# Patient Record
Sex: Female | Born: 1975 | Race: White | Hispanic: No | Marital: Married | Smoking: Current every day smoker
Health system: Southern US, Community
[De-identification: ages and names within clinical notes are randomized; demographics above are authoritative.]

## PROBLEM LIST (undated history)

## (undated) DIAGNOSIS — F429 Obsessive-compulsive disorder, unspecified: Secondary | ICD-10-CM

## (undated) DIAGNOSIS — C801 Malignant (primary) neoplasm, unspecified: Secondary | ICD-10-CM

## (undated) DIAGNOSIS — M1990 Unspecified osteoarthritis, unspecified site: Secondary | ICD-10-CM

## (undated) DIAGNOSIS — F419 Anxiety disorder, unspecified: Secondary | ICD-10-CM

## (undated) DIAGNOSIS — G5621 Lesion of ulnar nerve, right upper limb: Secondary | ICD-10-CM

## (undated) DIAGNOSIS — E559 Vitamin D deficiency, unspecified: Secondary | ICD-10-CM

## (undated) DIAGNOSIS — F909 Attention-deficit hyperactivity disorder, unspecified type: Secondary | ICD-10-CM

## (undated) DIAGNOSIS — F329 Major depressive disorder, single episode, unspecified: Secondary | ICD-10-CM

## (undated) DIAGNOSIS — D630 Anemia in neoplastic disease: Secondary | ICD-10-CM

## (undated) DIAGNOSIS — D649 Anemia, unspecified: Secondary | ICD-10-CM

## (undated) DIAGNOSIS — F32A Depression, unspecified: Secondary | ICD-10-CM

## (undated) HISTORY — DX: Malignant (primary) neoplasm, unspecified: C80.1

## (undated) HISTORY — DX: Anemia, unspecified: D64.9

## (undated) HISTORY — DX: Anxiety disorder, unspecified: F41.9

## (undated) HISTORY — PX: CHOLECYSTECTOMY: SHX55

## (undated) HISTORY — PX: OTHER SURGICAL HISTORY: SHX169

## (undated) HISTORY — DX: Anemia in neoplastic disease: D63.0

## (undated) HISTORY — PX: ORIF TIBIA & FIBULA FRACTURES: SHX2131

## (undated) HISTORY — PX: BACK SURGERY: SHX140

---

## 2006-03-06 HISTORY — PX: GASTRIC BYPASS: SHX52

## 2011-01-07 ENCOUNTER — Ambulatory Visit (INDEPENDENT_AMBULATORY_CARE_PROVIDER_SITE_OTHER): Payer: Self-pay

## 2011-01-07 ENCOUNTER — Inpatient Hospital Stay (INDEPENDENT_AMBULATORY_CARE_PROVIDER_SITE_OTHER)
Admission: RE | Admit: 2011-01-07 | Discharge: 2011-01-07 | Disposition: A | Discharge status: Home / Self Care | Payer: Self-pay | Source: Ambulatory Visit | Attending: Family Medicine | Admitting: Family Medicine

## 2011-01-07 DIAGNOSIS — J019 Acute sinusitis, unspecified: Secondary | ICD-10-CM

## 2011-01-07 DIAGNOSIS — J310 Chronic rhinitis: Secondary | ICD-10-CM

## 2011-02-08 ENCOUNTER — Encounter: Payer: Self-pay | Admitting: Internal Medicine

## 2011-02-21 ENCOUNTER — Telehealth: Payer: Self-pay | Admitting: Oncology

## 2011-02-21 NOTE — Telephone Encounter (Signed)
called pts lmovm to rtn call to scheduled new pt appt for 03/14/2011 °

## 2011-02-22 ENCOUNTER — Telehealth: Payer: Self-pay | Admitting: Oncology

## 2011-02-22 NOTE — Telephone Encounter (Signed)
pt rtn call and confirmed appt for 03/14/2011

## 2011-02-27 ENCOUNTER — Ambulatory Visit: Payer: Self-pay | Admitting: Internal Medicine

## 2011-03-10 ENCOUNTER — Telehealth: Payer: Self-pay | Admitting: Oncology

## 2011-03-10 NOTE — Telephone Encounter (Signed)
pt called and r/s appt on 01/08 to 04/20/2011

## 2011-03-14 ENCOUNTER — Ambulatory Visit: Payer: Self-pay | Admitting: Oncology

## 2011-03-14 ENCOUNTER — Other Ambulatory Visit: Payer: Self-pay | Admitting: Lab

## 2011-03-14 ENCOUNTER — Ambulatory Visit: Payer: Self-pay

## 2011-03-31 ENCOUNTER — Telehealth: Payer: Self-pay | Admitting: Oncology

## 2011-03-31 ENCOUNTER — Ambulatory Visit: Payer: PRIVATE HEALTH INSURANCE

## 2011-03-31 ENCOUNTER — Other Ambulatory Visit: Payer: Self-pay | Admitting: *Deleted

## 2011-03-31 ENCOUNTER — Ambulatory Visit (HOSPITAL_BASED_OUTPATIENT_CLINIC_OR_DEPARTMENT_OTHER): Payer: PRIVATE HEALTH INSURANCE | Admitting: Oncology

## 2011-03-31 ENCOUNTER — Other Ambulatory Visit (HOSPITAL_BASED_OUTPATIENT_CLINIC_OR_DEPARTMENT_OTHER): Payer: PRIVATE HEALTH INSURANCE | Admitting: Lab

## 2011-03-31 ENCOUNTER — Encounter: Payer: Self-pay | Admitting: Oncology

## 2011-03-31 ENCOUNTER — Other Ambulatory Visit: Payer: Self-pay | Admitting: Oncology

## 2011-03-31 VITALS — BP 125/79 | HR 76 | Temp 98.2°F

## 2011-03-31 DIAGNOSIS — E611 Iron deficiency: Secondary | ICD-10-CM

## 2011-03-31 DIAGNOSIS — C801 Malignant (primary) neoplasm, unspecified: Secondary | ICD-10-CM

## 2011-03-31 DIAGNOSIS — D509 Iron deficiency anemia, unspecified: Secondary | ICD-10-CM | POA: Insufficient documentation

## 2011-03-31 HISTORY — DX: Malignant (primary) neoplasm, unspecified: C80.1

## 2011-03-31 LAB — COMPREHENSIVE METABOLIC PANEL
ALT: 16 U/L (ref 0–35)
CO2: 25 mEq/L (ref 19–32)
Calcium: 9.4 mg/dL (ref 8.4–10.5)
Chloride: 104 mEq/L (ref 96–112)
Sodium: 139 mEq/L (ref 135–145)
Total Bilirubin: 0.3 mg/dL (ref 0.3–1.2)
Total Protein: 6.7 g/dL (ref 6.0–8.3)

## 2011-03-31 LAB — CBC WITH DIFFERENTIAL/PLATELET
BASO%: 0.4 % (ref 0.0–2.0)
EOS%: 6.2 % (ref 0.0–7.0)
LYMPH%: 17.9 % (ref 14.0–49.7)
MCH: 27.1 pg (ref 25.1–34.0)
MCHC: 32.2 g/dL (ref 31.5–36.0)
MONO#: 0.9 10*3/uL (ref 0.1–0.9)
Platelets: 423 10*3/uL — ABNORMAL HIGH (ref 145–400)
RBC: 3.89 10*6/uL (ref 3.70–5.45)
WBC: 12.7 10*3/uL — ABNORMAL HIGH (ref 3.9–10.3)

## 2011-03-31 LAB — IRON AND TIBC
%SAT: 18 % — ABNORMAL LOW (ref 20–55)
TIBC: 456 ug/dL (ref 250–470)

## 2011-03-31 LAB — RETICULOCYTES
Immature Retic Fract: 22 % — ABNORMAL HIGH (ref 1.60–10.00)
RBC: 3.97 10*6/uL (ref 3.70–5.45)

## 2011-03-31 NOTE — Telephone Encounter (Signed)
Sent POF to scheduler with feraheme scheduling for 04/04/11. Requested they call patient with appointment. Orders in computer.

## 2011-04-01 ENCOUNTER — Telehealth: Payer: Self-pay | Admitting: *Deleted

## 2011-04-01 NOTE — Telephone Encounter (Signed)
patient confirmed over the phone the new date and time on 04-04-2011 for one hour infusion

## 2011-04-03 ENCOUNTER — Encounter: Payer: Self-pay | Admitting: *Deleted

## 2011-04-03 ENCOUNTER — Telehealth: Payer: Self-pay | Admitting: *Deleted

## 2011-04-03 ENCOUNTER — Encounter: Payer: Self-pay | Admitting: Oncology

## 2011-04-03 DIAGNOSIS — D630 Anemia in neoplastic disease: Secondary | ICD-10-CM

## 2011-04-03 NOTE — Progress Notes (Unsigned)
Jacki Cones from Scheduling came over to desk to advise she called pt regarding lab appt time/date for 04/04/11. Pt informed scheduler she was on her "monthly" so she didn't need any labs if they were for a pregnancy test.

## 2011-04-03 NOTE — Progress Notes (Signed)
Leah Herrera [id number] 1975-12-17 36 y.o.  Cc: Dr. Teodora Medici Dr. 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 Patient has received parenteral iron in the past while she was in [place]   REFERRING PHYSICIAN: Dr. Teodora Medici  HISTORY OF PRESENT ILLNESS:  Leah Herrera is a 36 y.o. female.  Without significant family history or personal history. 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 Due to that she developed iron deficiency anemia. She has received IV iron in the past. Most recent one was when she was in [place]. She has been seeing Dr. Dimas Aguas meds are for ongoing fertility treatments. Because of her issues you have iron deficiency in the past and patient's desire to get pregnant patient had a CBC performed including iron studies. Apparently she was found to have very low iron levels and patient was referred to hematology for further evaluation and management of iron deficiency anemia. Also to possibly consider parenteral iron to replenish her stores prior to getting pregnant. Patient today we are did come into the office pretty late and then she was in fact in a quite bit of her he due to the fact that her husband needed to go back to work and she is from [place]. She however upon questioning denies any fevers chills night sweats headaches shortness of breath chest pains palpitations she has no myalgias or arthralgias. She has not noticed any bleeding problems no hematuria hematochezia melena hemoptysis or hematemesis area and remainder of the 14 point review of systems is negative   Past Medical History  Diagnosis Date  . 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 Anemia   . Anxiety     History reviewed. No pertinent past surgical history.  History reviewed. No pertinent family history.  Social History History  Substance Use  Topics  . Smoking status: Never Smoker   . Smokeless tobacco: Never Used  . Alcohol Use: No    Allergies  Allergen Reactions  . Penicillins     No current outpatient prescriptions on file.    REVIEW OF SYSTEMS:  Constitutional: negative Eyes: negative Ears, nose, mouth, throat, and face: negative Respiratory: negative Cardiovascular: negative Gastrointestinal: negative Genitourinary:negative Hematologic/lymphatic: negative Musculoskeletal:negative Neurological: negative Behavioral/Psych: positive for anxiety and fatigue  ECOG performance Status:  PHYSICAL EXAMINATION: Blood pressure 125/79, pulse 76, temperature 98.2 F (36.8 C). NWG:NFAOZ, healthy, no distress, well nourished, well developed, anxious and distressed SKIN: skin color, texture, turgor are normal, no rashes or significant lesions HEAD: No masses, lesions, tenderness or abnormalities EYES: PERRLA, EOMI, Conjunctiva are pink and non-injected, sclera clear EARS: External ears normal OROPHARYNX:no exudate, no erythema and lips, buccal mucosa, and tongue normal  NECK: supple, no adenopathy, no bruits, no JVD, thyroid normal size, non-tender, without nodularity LYMPH:  no palpable lymphadenopathy, no hepatosplenomegaly BREAST:not examined LUNGS: clear to auscultation and percussion HEART: regular rate & rhythm, no murmurs and no gallops ABDOMEN:abdomen soft, non-tender, normal bowel sounds and no masses or organomegaly BACK: Back symmetric, no curvature., No CVA tenderness EXTREMITIES:no edema, no clubbing, no cyanosis  NEURO: alert & oriented x 3 with fluent speech, no focal motor/sensory deficits, gait normal, reflexes normal and symmetric  STUDIES/RESULTS: Lab Results  Component Value Date   WBC 12.7* 03/31/2011   HGB 10.5* 03/31/2011   HCT 32.8* 03/31/2011   MCV 84.3 03/31/2011   PLT 423* 03/31/2011     Chemistry  Component Value Date/Time   NA 139 03/31/2011 1342   K 4.7 03/31/2011 1342   CL  104 03/31/2011 1342   CO2 25 03/31/2011 1342   BUN 10 03/31/2011 1342   CREATININE 0.80 03/31/2011 1342      Component Value Date/Time   CALCIUM 9.4 03/31/2011 1342   ALKPHOS 69 03/31/2011 1342   AST 25 03/31/2011 1342   ALT 16 03/31/2011 1342   BILITOT 0.3 03/31/2011 1342      ASSESSMENT  36 year old female with previous history of gastric bypass now with 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 Patient is currently on some oral iron but she is quite concerned about her ongoing fatigue and weakness do to iron deficiency. She is considering receiving parenteral iron as she has done so in the past. Apparently from what she is telling me she has had iron dextran in the past the infusion lasted about 6 hours or so. She had no problems with it no side effects.  PLAN: I have reviewed patient's smear and she indeed does have a little bit of microcytosis. Recommendation is for patient to continue oral iron. However in order to replenish her stores quicker she may require parenteral iron. I have recommended getting iron studies on her as I am not able to locate the studies that were done and her gynecologist's office. We have ordered a ferritin TIBC and a total iron.  I have gone over the risks and benefits of parenteral iron including anaphylactic reactions. Patient also tells me that she is trying to get pregnant and I have my concerns of giving her parenteral iron. She states that she may get her menstrual cycle over the weekend most likely. Patient understands and I have of made this very clear to her that we do not give parenteral iron in the first or second trimester of pregnancy due to fetal losses. However third trimester parenteral iron can be given relatively safely. However it has to be given in a monitored setting at [facility]. Patient understands the implications of this. She certainly will let me know if she is indeed pregnant.  Patient also was concerned about not  being given the proper health care do to her being lost initial fall in the past. I promised her that I would call her and give her the information about her iron studies as soon as they are available. In fact I informed her that I would go ahead and get her set up for parenteral iron as soon as I see the results.   All questions were answered. The patient knows to call the clinic with any problems, questions or concerns. We can certainly see the patient much sooner if necessary.  Thank you so much for allowing me to participate in the care of Leah Herrera. I will continue to follow up the patient with you and assist in her care.  I spent 40 minutes counseling the patient face to face. The total time spent in the appointment was 60 minutes. Drue Second, MD Medical/Oncology [facility] [phone] (beeper) [phone] (Office)  04/03/2011, 9:37 PM 04/03/2011, 9:37 PM

## 2011-04-03 NOTE — Telephone Encounter (Signed)
Pt called left message  Stating " I was told my appt would be Thursday but then I got a call about my appt on Tuesday. Just want to make sure we are all on the same page." Returned pt's call, looking back in chart pt was called on Saturday by schedulers -to be scheduled on 04/04/11 per MD order. Confirmed with pt her appt for IV Iron is for Tues 04/04/11  At 11am per MD order.  Pt again questioned why she was to come in on Tues 04/04/11. Reviewed with pt per Dr. Milta Deiters order she was to receive her IV Iron on Tues 04/04/11 11am.  Pt again questioned why Tuesday instead of Thursday. Dr. Welton Flakes personally spoke with pt at this time and informed pt her IV Iron was to be on Tuesday 04/04/11.  Pt confirmed with MD she would be here for IV Iron on Tuesday 04/04/11 11am.  Per MD Verbal Order for pt to have pregnancy test prior to IV Iron given.  Onc Tx Sched sent for pt to have lab appt / pregnancy test prior to IV Iron.

## 2011-04-03 NOTE — Telephone Encounter (Signed)
Requested to Speak to Ms Leah Herrera,, Female answering advised sh could not come to phone. Requested for pt to call Dr. Milta Deiters nurse as pt is to have lab prior to appt. Female voice advised " yeah she spoke to someone already this am"  RN again requested pt to call to confirm Tuesday scheduled appts. He advised he would tell her to call.

## 2011-04-03 NOTE — Telephone Encounter (Signed)
Per scheduler "pt is on Monthly and if the lab is about a pregnancy test she doesn't need it."  Informed MD who advised pt was to call and notify us, cancel lab appt. onc Tx schedule sent

## 2011-04-04 ENCOUNTER — Ambulatory Visit: Payer: PRIVATE HEALTH INSURANCE

## 2011-04-04 ENCOUNTER — Other Ambulatory Visit: Payer: Self-pay

## 2011-04-04 ENCOUNTER — Telehealth: Payer: Self-pay | Admitting: *Deleted

## 2011-04-04 ENCOUNTER — Other Ambulatory Visit: Payer: PRIVATE HEALTH INSURANCE | Admitting: Lab

## 2011-04-04 ENCOUNTER — Ambulatory Visit (HOSPITAL_BASED_OUTPATIENT_CLINIC_OR_DEPARTMENT_OTHER): Payer: PRIVATE HEALTH INSURANCE

## 2011-04-04 VITALS — BP 118/72 | HR 87 | Temp 97.7°F

## 2011-04-04 DIAGNOSIS — D630 Anemia in neoplastic disease: Secondary | ICD-10-CM

## 2011-04-04 DIAGNOSIS — C801 Malignant (primary) neoplasm, unspecified: Secondary | ICD-10-CM

## 2011-04-04 MED ORDER — SODIUM CHLORIDE 0.9 % IV SOLN
1020.0000 mg | Freq: Once | INTRAVENOUS | Status: AC
  Administered 2011-04-04: 1020 mg via INTRAVENOUS
  Filled 2011-04-04: qty 34

## 2011-04-04 MED ORDER — SODIUM CHLORIDE 0.9 % IV SOLN: Freq: Once | INTRAVENOUS | Status: DC

## 2011-04-04 MED ORDER — SODIUM CHLORIDE 0.9 % IJ SOLN
3.0000 mL | Freq: Once | INTRAMUSCULAR | Status: DC | PRN
  Filled 2011-04-04: qty 10

## 2011-04-04 NOTE — Telephone Encounter (Signed)
  Pt lmovm at 11:12am states "there has been a mix up with my appts I have a complicated story to tell you if you are interested." Entered Chemo room at 12:08 to find pt to discuss her concerns. RN caring for pt advised she had already left. Will attempt to call pt at [phone].

## 2011-04-04 NOTE — Patient Instructions (Signed)
Pt ambulates out of dept with husband.  Tolerated 1st feraheme well.  Aware of how to contact Dr Milta Deiters nurse or on call MD if problems arise. dph

## 2011-04-04 NOTE — Telephone Encounter (Signed)
Na

## 2011-04-04 NOTE — Telephone Encounter (Signed)
Attempted to contact pt at P#  [phone] at 12:51 to discuss her concern. No answer, Left pt voice mail that I was returning her call and to call back to discuss her concerns from this morning.

## 2011-04-05 ENCOUNTER — Telehealth: Payer: Self-pay | Admitting: *Deleted

## 2011-04-05 NOTE — Telephone Encounter (Signed)
Called pt, who voiced her concerns regarding her scheduled appts , communication regarding her treatment times/dates.  Discussed with pt I would take her concerns to Dr. Welton Flakes and management immediately.  I voiced that I appreciated her informing me of the situation and I would be speaking with MD and management after I hang up with her.  Informed Dr. Welton Flakes who escorted me to speak with Jule Ser Director Med Onc about pt's concerns/situation. After speaking with him he informed Dr. Welton Flakes and I he would also call pt regarding her concern.

## 2011-04-20 ENCOUNTER — Ambulatory Visit: Payer: Self-pay

## 2011-04-20 ENCOUNTER — Encounter: Payer: Self-pay | Admitting: *Deleted

## 2011-04-20 ENCOUNTER — Ambulatory Visit: Payer: Self-pay | Admitting: Oncology

## 2011-04-20 ENCOUNTER — Telehealth: Payer: Self-pay | Admitting: *Deleted

## 2011-04-20 ENCOUNTER — Other Ambulatory Visit: Payer: Self-pay | Admitting: Lab

## 2011-04-20 NOTE — Telephone Encounter (Signed)
Reviewed with MD pt's concerns. Per MD ok to reschedule appt per pt request.  Called LMOVM for per MD ok to reschedule 04/21/11 appt. Scheduler will call her to set up appt.  Onc Tx [employer]

## 2011-04-20 NOTE — Telephone Encounter (Signed)
Returned pt's call regarding visit per Dr. Welton Flakes pt has an appt for f/u to check her blood levels and discuss if future treatments are needed.  Pt states " Can we push that appt to later in the month because I haven't paid for my insurance."   Discussed with pt I was not sure of the appts available going forward for month of february, I will check with MD regarding her concern.  Informed pt she would receive a call from scheduling for any changes regarding her appt.  Pt verbalized understanding

## 2011-04-20 NOTE — Telephone Encounter (Signed)
Pt called LMOVM states "I got a call stating I have an appt on 04/21/11 and I need to know why I am coming."

## 2011-04-21 ENCOUNTER — Ambulatory Visit: Payer: PRIVATE HEALTH INSURANCE | Admitting: Family

## 2011-04-21 ENCOUNTER — Other Ambulatory Visit: Payer: PRIVATE HEALTH INSURANCE | Admitting: Lab

## 2011-10-20 DIAGNOSIS — M542 Cervicalgia: Secondary | ICD-10-CM | POA: Insufficient documentation

## 2011-10-20 DIAGNOSIS — M545 Low back pain, unspecified: Secondary | ICD-10-CM | POA: Insufficient documentation

## 2011-10-20 DIAGNOSIS — G8929 Other chronic pain: Secondary | ICD-10-CM | POA: Insufficient documentation

## 2011-10-25 ENCOUNTER — Other Ambulatory Visit: Payer: Self-pay | Admitting: Family Medicine

## 2011-10-25 DIAGNOSIS — M542 Cervicalgia: Secondary | ICD-10-CM

## 2011-10-25 DIAGNOSIS — M545 Low back pain: Secondary | ICD-10-CM

## 2011-10-29 ENCOUNTER — Ambulatory Visit
Admission: RE | Admit: 2011-10-29 | Discharge: 2011-10-29 | Disposition: A | Discharge status: Home / Self Care | Payer: 59 | Source: Ambulatory Visit | Attending: Family Medicine | Admitting: Family Medicine

## 2011-10-29 DIAGNOSIS — M545 Low back pain: Secondary | ICD-10-CM

## 2011-10-29 DIAGNOSIS — M542 Cervicalgia: Secondary | ICD-10-CM

## 2011-10-29 MED ORDER — GADOBENATE DIMEGLUMINE 529 MG/ML IV SOLN
13.0000 mL | Freq: Once | INTRAVENOUS | Status: AC | PRN
  Administered 2011-10-29: 13 mL via INTRAVENOUS

## 2012-01-24 ENCOUNTER — Ambulatory Visit: Payer: Self-pay | Admitting: Oncology

## 2012-01-29 ENCOUNTER — Ambulatory Visit: Payer: Self-pay | Admitting: Oncology

## 2012-02-04 ENCOUNTER — Ambulatory Visit: Payer: Self-pay | Admitting: Oncology

## 2012-03-06 ENCOUNTER — Ambulatory Visit: Payer: Self-pay | Admitting: Oncology

## 2012-03-25 LAB — CBC CANCER CENTER
Bands: 1 %
Basophil: 1 %
Eosinophil: 1 %
HCT: 45 % (ref 35.0–47.0)
HGB: 15 g/dL (ref 12.0–16.0)
Lymphocytes: 14 %
MCH: 29.8 pg (ref 26.0–34.0)
MCHC: 33.2 g/dL (ref 32.0–36.0)
Monocytes: 1 %
RBC: 5.01 10*6/uL (ref 3.80–5.20)
RDW: 24.3 % — ABNORMAL HIGH (ref 11.5–14.5)
Segmented Neutrophils: 79 %
Variant Lymphocyte: 3 %
WBC: 11 x10 3/mm (ref 3.6–11.0)

## 2012-03-25 LAB — IRON AND TIBC
Iron Saturation: 22 %
Unbound Iron-Bind.Cap.: 297 ug/dL

## 2012-03-25 LAB — SEDIMENTATION RATE: Erythrocyte Sed Rate: 3 mm/hr (ref 0–20)

## 2012-03-25 LAB — FOLATE: Folic Acid: 3.3 ng/mL (ref 3.1–100.0)

## 2012-04-06 ENCOUNTER — Ambulatory Visit: Payer: Self-pay | Admitting: Oncology

## 2012-06-03 ENCOUNTER — Ambulatory Visit: Payer: Self-pay | Admitting: Oncology

## 2012-06-03 LAB — IRON AND TIBC
Iron Bind.Cap.(Total): 395 ug/dL (ref 250–450)
Iron Saturation: 19 %
Unbound Iron-Bind.Cap.: 318 ug/dL

## 2012-06-03 LAB — FERRITIN: Ferritin (ARMC): 12 ng/mL (ref 8–388)

## 2012-06-03 LAB — CBC CANCER CENTER
Basophil %: 0.5 %
HCT: 43.4 % (ref 35.0–47.0)
HGB: 14.7 g/dL (ref 12.0–16.0)
MCH: 31.2 pg (ref 26.0–34.0)
MCHC: 33.8 g/dL (ref 32.0–36.0)
Monocyte #: 0.7 x10 3/mm (ref 0.2–0.9)
Monocyte %: 5 %
Neutrophil #: 10.1 x10 3/mm — ABNORMAL HIGH (ref 1.4–6.5)
RBC: 4.69 10*6/uL (ref 3.80–5.20)
RDW: 13.6 % (ref 11.5–14.5)

## 2012-06-04 ENCOUNTER — Ambulatory Visit: Payer: Self-pay | Admitting: Oncology

## 2012-09-30 ENCOUNTER — Other Ambulatory Visit: Payer: Self-pay | Admitting: Gastroenterology

## 2012-09-30 DIAGNOSIS — R634 Abnormal weight loss: Secondary | ICD-10-CM

## 2012-09-30 DIAGNOSIS — R112 Nausea with vomiting, unspecified: Secondary | ICD-10-CM

## 2012-09-30 DIAGNOSIS — R109 Unspecified abdominal pain: Secondary | ICD-10-CM

## 2012-10-04 ENCOUNTER — Other Ambulatory Visit: Payer: 59

## 2012-10-04 ENCOUNTER — Ambulatory Visit: Payer: Self-pay | Admitting: Oncology

## 2012-10-09 ENCOUNTER — Ambulatory Visit: Payer: Self-pay | Admitting: Gastroenterology

## 2012-10-11 ENCOUNTER — Ambulatory Visit
Admission: RE | Admit: 2012-10-11 | Discharge: 2012-10-11 | Disposition: A | Discharge status: Home / Self Care | Payer: 59 | Source: Ambulatory Visit | Attending: Gastroenterology | Admitting: Gastroenterology

## 2012-10-11 ENCOUNTER — Other Ambulatory Visit: Payer: Self-pay | Admitting: Gastroenterology

## 2012-10-11 DIAGNOSIS — R634 Abnormal weight loss: Secondary | ICD-10-CM

## 2012-10-11 DIAGNOSIS — R109 Unspecified abdominal pain: Secondary | ICD-10-CM

## 2012-10-11 DIAGNOSIS — R112 Nausea with vomiting, unspecified: Secondary | ICD-10-CM

## 2012-10-11 LAB — CBC CANCER CENTER
Basophil %: 0.9 %
Eosinophil #: 0.3 x10 3/mm (ref 0.0–0.7)
Eosinophil %: 3.3 %
HCT: 36 % (ref 35.0–47.0)
HGB: 12.3 g/dL (ref 12.0–16.0)
Lymphocyte #: 2.4 x10 3/mm (ref 1.0–3.6)
Lymphocyte %: 23.4 %
MCH: 30.6 pg (ref 26.0–34.0)
MCHC: 34.2 g/dL (ref 32.0–36.0)
MCV: 90 fL (ref 80–100)
Monocyte %: 6 %
Neutrophil #: 6.9 x10 3/mm — ABNORMAL HIGH (ref 1.4–6.5)
Neutrophil %: 66.4 %
Platelet: 394 x10 3/mm (ref 150–440)
RDW: 14 % (ref 11.5–14.5)
WBC: 10.4 x10 3/mm (ref 3.6–11.0)

## 2012-10-11 LAB — IRON AND TIBC
Iron Bind.Cap.(Total): 364 ug/dL (ref 250–450)
Iron: 32 ug/dL — ABNORMAL LOW (ref 50–170)
Unbound Iron-Bind.Cap.: 332 ug/dL

## 2012-11-04 ENCOUNTER — Ambulatory Visit: Payer: Self-pay | Admitting: Oncology

## 2013-01-10 ENCOUNTER — Ambulatory Visit: Payer: Self-pay | Admitting: Oncology

## 2013-01-10 LAB — CBC CANCER CENTER
Basophil %: 0.4 %
Eosinophil %: 0.9 %
HCT: 43 % (ref 35.0–47.0)
Lymphocyte %: 10.1 %
MCHC: 32.7 g/dL (ref 32.0–36.0)
MCV: 92 fL (ref 80–100)
Monocyte #: 1 x10 3/mm — ABNORMAL HIGH (ref 0.2–0.9)
Neutrophil #: 11.7 x10 3/mm — ABNORMAL HIGH (ref 1.4–6.5)
Neutrophil %: 81.9 %
RBC: 4.66 10*6/uL (ref 3.80–5.20)
WBC: 14.3 x10 3/mm — ABNORMAL HIGH (ref 3.6–11.0)

## 2013-01-10 LAB — IRON AND TIBC
Iron Saturation: 8 %
Iron: 17 ug/dL — ABNORMAL LOW (ref 50–170)

## 2013-01-10 LAB — FERRITIN: Ferritin (ARMC): 97 ng/mL (ref 8–388)

## 2013-01-16 ENCOUNTER — Emergency Department: Payer: Self-pay | Admitting: Emergency Medicine

## 2013-01-16 LAB — URINALYSIS, COMPLETE
Glucose,UR: NEGATIVE mg/dL (ref 0–75)
Nitrite: NEGATIVE
Ph: 7 (ref 4.5–8.0)
Protein: NEGATIVE
RBC,UR: 1 /HPF (ref 0–5)
Specific Gravity: 1.002 (ref 1.003–1.030)
Squamous Epithelial: 1
WBC UR: <1 /HPF (ref 0–5)

## 2013-01-16 LAB — COMPREHENSIVE METABOLIC PANEL
Anion Gap: 4 — ABNORMAL LOW (ref 7–16)
Creatinine: 0.73 mg/dL (ref 0.60–1.30)
EGFR (African American): >60
EGFR (Non-African Amer.): >60
Osmolality: 276 (ref 275–301)
Potassium: 3.3 mmol/L — ABNORMAL LOW (ref 3.5–5.1)
SGPT (ALT): 20 U/L (ref 12–78)
Sodium: 140 mmol/L (ref 136–145)
Total Protein: 7.2 g/dL (ref 6.4–8.2)

## 2013-01-16 LAB — PREGNANCY, URINE: Pregnancy Test, Urine: NEGATIVE m[IU]/mL

## 2013-01-16 LAB — CBC
MCHC: 34 g/dL (ref 32.0–36.0)
MCV: 92 fL (ref 80–100)
Platelet: 330 10*3/uL (ref 150–440)
RBC: 4.25 10*6/uL (ref 3.80–5.20)
WBC: 10.8 10*3/uL (ref 3.6–11.0)

## 2013-01-16 LAB — LITHIUM LEVEL: Lithium: 0.26 mmol/L — ABNORMAL LOW

## 2013-01-16 LAB — LIPASE, BLOOD: Lipase: 144 U/L (ref 73–393)

## 2013-02-03 ENCOUNTER — Ambulatory Visit: Payer: Self-pay | Admitting: Oncology

## 2013-04-14 ENCOUNTER — Ambulatory Visit: Payer: Self-pay | Admitting: Oncology

## 2013-04-15 LAB — CBC CANCER CENTER
BASOS ABS: 0.1 x10 3/mm (ref 0.0–0.1)
Basophil %: 0.6 %
Eosinophil #: 0.2 x10 3/mm (ref 0.0–0.7)
Eosinophil %: 0.8 %
HCT: 41 % (ref 35.0–47.0)
HGB: 13.4 g/dL (ref 12.0–16.0)
LYMPHS PCT: 9.2 %
Lymphocyte #: 1.9 x10 3/mm (ref 1.0–3.6)
MCH: 30.8 pg (ref 26.0–34.0)
MCHC: 32.5 g/dL (ref 32.0–36.0)
MCV: 95 fL (ref 80–100)
Monocyte #: 1.1 x10 3/mm — ABNORMAL HIGH (ref 0.2–0.9)
Monocyte %: 5.3 %
NEUTROS PCT: 84.1 %
Neutrophil #: 17.9 x10 3/mm — ABNORMAL HIGH (ref 1.4–6.5)
PLATELETS: 367 x10 3/mm (ref 150–440)
RBC: 4.34 10*6/uL (ref 3.80–5.20)
RDW: 13.1 % (ref 11.5–14.5)
WBC: 21.2 x10 3/mm — ABNORMAL HIGH (ref 3.6–11.0)

## 2013-04-15 LAB — IRON AND TIBC
IRON BIND. CAP.(TOTAL): 302 ug/dL (ref 250–450)
Iron Saturation: 17 %
Iron: 52 ug/dL (ref 50–170)
Unbound Iron-Bind.Cap.: 250 ug/dL

## 2013-04-15 LAB — FERRITIN: Ferritin (ARMC): 10 ng/mL (ref 8–388)

## 2013-05-04 ENCOUNTER — Ambulatory Visit: Payer: Self-pay | Admitting: Oncology

## 2013-07-11 ENCOUNTER — Ambulatory Visit: Payer: Self-pay | Admitting: Oncology

## 2014-04-10 ENCOUNTER — Ambulatory Visit: Payer: Self-pay | Admitting: Oncology

## 2014-04-10 LAB — CBC CANCER CENTER
BASOS ABS: 0.1 x10 3/mm (ref 0.0–0.1)
BASOS PCT: 0.9 %
Eosinophil #: 0.3 x10 3/mm (ref 0.0–0.7)
Eosinophil %: 2.6 %
HCT: 35.8 % (ref 35.0–47.0)
HGB: 11.8 g/dL — ABNORMAL LOW (ref 12.0–16.0)
Lymphocyte #: 2.2 x10 3/mm (ref 1.0–3.6)
Lymphocyte %: 19.3 %
MCH: 29.1 pg (ref 26.0–34.0)
MCHC: 32.9 g/dL (ref 32.0–36.0)
MCV: 89 fL (ref 80–100)
Monocyte #: 0.8 x10 3/mm (ref 0.2–0.9)
Monocyte %: 6.6 %
NEUTROS ABS: 8.2 x10 3/mm — AB (ref 1.4–6.5)
Neutrophil %: 70.6 %
Platelet: 482 x10 3/mm — ABNORMAL HIGH (ref 150–440)
RBC: 4.04 10*6/uL (ref 3.80–5.20)
RDW: 13.8 % (ref 11.5–14.5)
WBC: 11.6 x10 3/mm — ABNORMAL HIGH (ref 3.6–11.0)

## 2014-04-10 LAB — IRON AND TIBC
IRON BIND. CAP.(TOTAL): 473 ug/dL — AB (ref 250–450)
IRON SATURATION: 7 %
Iron: 35 ug/dL — ABNORMAL LOW (ref 50–170)
UNBOUND IRON-BIND. CAP.: 438 ug/dL

## 2014-04-10 LAB — FERRITIN: Ferritin (ARMC): 4 ng/mL — ABNORMAL LOW (ref 8–388)

## 2014-05-05 ENCOUNTER — Ambulatory Visit
Admit: 2014-05-05 | Disposition: A | Discharge status: Home / Self Care | Payer: Self-pay | Attending: Oncology | Admitting: Oncology

## 2014-06-18 ENCOUNTER — Ambulatory Visit
Admit: 2014-06-18 | Disposition: A | Discharge status: Home / Self Care | Payer: Self-pay | Attending: Oncology | Admitting: Oncology

## 2014-07-11 ENCOUNTER — Other Ambulatory Visit: Payer: Self-pay | Admitting: Oncology

## 2014-07-11 DIAGNOSIS — D509 Iron deficiency anemia, unspecified: Secondary | ICD-10-CM

## 2014-07-13 ENCOUNTER — Other Ambulatory Visit: Payer: Self-pay | Admitting: Oncology

## 2014-07-13 DIAGNOSIS — D509 Iron deficiency anemia, unspecified: Secondary | ICD-10-CM

## 2014-07-14 ENCOUNTER — Ambulatory Visit: Payer: 59 | Admitting: Oncology

## 2014-07-14 ENCOUNTER — Encounter (INDEPENDENT_AMBULATORY_CARE_PROVIDER_SITE_OTHER): Payer: Self-pay

## 2014-07-14 ENCOUNTER — Other Ambulatory Visit: Payer: Self-pay

## 2014-07-14 ENCOUNTER — Inpatient Hospital Stay: Payer: 59 | Attending: Oncology

## 2014-07-14 DIAGNOSIS — D509 Iron deficiency anemia, unspecified: Secondary | ICD-10-CM | POA: Insufficient documentation

## 2014-07-14 DIAGNOSIS — E538 Deficiency of other specified B group vitamins: Secondary | ICD-10-CM | POA: Diagnosis not present

## 2014-07-14 LAB — CBC WITH DIFFERENTIAL/PLATELET
Basophils Absolute: 0.1 10*3/uL (ref 0–0.1)
Basophils Relative: 1 %
Eosinophils Absolute: 0.5 10*3/uL (ref 0–0.7)
Eosinophils Relative: 4 %
HCT: 40.4 % (ref 35.0–47.0)
Hemoglobin: 13.2 g/dL (ref 12.0–16.0)
LYMPHS ABS: 2.6 10*3/uL (ref 1.0–3.6)
LYMPHS PCT: 21 %
MCH: 31 pg (ref 26.0–34.0)
MCHC: 32.7 g/dL (ref 32.0–36.0)
MCV: 94.7 fL (ref 80.0–100.0)
MONOS PCT: 5 %
Monocytes Absolute: 0.7 10*3/uL (ref 0.2–0.9)
NEUTROS ABS: 8.6 10*3/uL — AB (ref 1.4–6.5)
NEUTROS PCT: 69 %
PLATELETS: 327 10*3/uL (ref 150–440)
RBC: 4.27 MIL/uL (ref 3.80–5.20)
RDW: 14.9 % — ABNORMAL HIGH (ref 11.5–14.5)
WBC: 12.5 10*3/uL — ABNORMAL HIGH (ref 3.6–11.0)

## 2014-07-14 LAB — IRON AND TIBC
IRON: 53 ug/dL (ref 28–170)
SATURATION RATIOS: 16 % (ref 10.4–31.8)
TIBC: 338 ug/dL (ref 250–450)
UIBC: 285 ug/dL

## 2014-07-14 LAB — FERRITIN: FERRITIN: 16 ng/mL (ref 11–307)

## 2014-08-26 ENCOUNTER — Encounter (HOSPITAL_BASED_OUTPATIENT_CLINIC_OR_DEPARTMENT_OTHER): Payer: Self-pay | Admitting: *Deleted

## 2014-08-28 ENCOUNTER — Ambulatory Visit (HOSPITAL_BASED_OUTPATIENT_CLINIC_OR_DEPARTMENT_OTHER): Payer: 59 | Admitting: Anesthesiology

## 2014-08-28 ENCOUNTER — Encounter (HOSPITAL_BASED_OUTPATIENT_CLINIC_OR_DEPARTMENT_OTHER)
Admission: RE | Disposition: A | Discharge status: Home / Self Care | Payer: Self-pay | Source: Ambulatory Visit | Attending: Orthopedic Surgery

## 2014-08-28 ENCOUNTER — Encounter (HOSPITAL_BASED_OUTPATIENT_CLINIC_OR_DEPARTMENT_OTHER): Payer: Self-pay | Admitting: Anesthesiology

## 2014-08-28 ENCOUNTER — Ambulatory Visit (HOSPITAL_BASED_OUTPATIENT_CLINIC_OR_DEPARTMENT_OTHER)
Admission: RE | Admit: 2014-08-28 | Discharge: 2014-08-28 | Disposition: A | Discharge status: Home / Self Care | Payer: 59 | Source: Ambulatory Visit | Attending: Orthopedic Surgery | Admitting: Orthopedic Surgery

## 2014-08-28 DIAGNOSIS — M1990 Unspecified osteoarthritis, unspecified site: Secondary | ICD-10-CM | POA: Insufficient documentation

## 2014-08-28 DIAGNOSIS — F17200 Nicotine dependence, unspecified, uncomplicated: Secondary | ICD-10-CM | POA: Insufficient documentation

## 2014-08-28 DIAGNOSIS — Z79899 Other long term (current) drug therapy: Secondary | ICD-10-CM | POA: Diagnosis not present

## 2014-08-28 DIAGNOSIS — D649 Anemia, unspecified: Secondary | ICD-10-CM | POA: Insufficient documentation

## 2014-08-28 DIAGNOSIS — G5621 Lesion of ulnar nerve, right upper limb: Secondary | ICD-10-CM | POA: Diagnosis present

## 2014-08-28 HISTORY — DX: Unspecified osteoarthritis, unspecified site: M19.90

## 2014-08-28 HISTORY — PX: ULNAR NERVE TRANSPOSITION: SHX2595

## 2014-08-28 HISTORY — DX: Major depressive disorder, single episode, unspecified: F32.9

## 2014-08-28 HISTORY — DX: Depression, unspecified: F32.A

## 2014-08-28 HISTORY — DX: Lesion of ulnar nerve, right upper limb: G56.21

## 2014-08-28 LAB — POCT HEMOGLOBIN-HEMACUE: HEMOGLOBIN: 13 g/dL (ref 12.0–15.0)

## 2014-08-28 SURGERY — ULNAR NERVE DECOMPRESSION/TRANSPOSITION
Anesthesia: General | Site: Elbow | Laterality: Right

## 2014-08-28 MED ORDER — PROPOFOL 10 MG/ML IV BOLUS
INTRAVENOUS | Status: DC | PRN
  Administered 2014-08-28: 180 mg via INTRAVENOUS

## 2014-08-28 MED ORDER — FENTANYL CITRATE (PF) 100 MCG/2ML IJ SOLN
INTRAMUSCULAR | Status: AC
  Filled 2014-08-28: qty 2

## 2014-08-28 MED ORDER — HYDROMORPHONE HCL 1 MG/ML IJ SOLN
0.2500 mg | INTRAMUSCULAR | Status: DC | PRN
  Administered 2014-08-28: 0.5 mg via INTRAVENOUS

## 2014-08-28 MED ORDER — CEFAZOLIN SODIUM-DEXTROSE 2-3 GM-% IV SOLR
INTRAVENOUS | Status: DC | PRN
  Administered 2014-08-28: 2 g via INTRAVENOUS

## 2014-08-28 MED ORDER — FENTANYL CITRATE (PF) 100 MCG/2ML IJ SOLN
INTRAMUSCULAR | Status: AC
  Filled 2014-08-28: qty 4

## 2014-08-28 MED ORDER — LACTATED RINGERS IV SOLN
INTRAVENOUS | Status: DC
  Administered 2014-08-28 (×3): via INTRAVENOUS

## 2014-08-28 MED ORDER — FENTANYL CITRATE (PF) 100 MCG/2ML IJ SOLN
50.0000 ug | INTRAMUSCULAR | Status: DC | PRN
  Administered 2014-08-28 (×2): 50 ug via INTRAVENOUS

## 2014-08-28 MED ORDER — PROMETHAZINE HCL 25 MG/ML IJ SOLN: 6.2500 mg | INTRAMUSCULAR | Status: DC | PRN

## 2014-08-28 MED ORDER — ONDANSETRON HCL 4 MG/2ML IJ SOLN
INTRAMUSCULAR | Status: DC | PRN
  Administered 2014-08-28: 4 mg via INTRAVENOUS

## 2014-08-28 MED ORDER — MIDAZOLAM HCL 2 MG/2ML IJ SOLN
INTRAMUSCULAR | Status: AC
  Filled 2014-08-28: qty 2

## 2014-08-28 MED ORDER — SCOPOLAMINE 1 MG/3DAYS TD PT72: 1.0000 | MEDICATED_PATCH | Freq: Once | TRANSDERMAL | Status: DC | PRN

## 2014-08-28 MED ORDER — KETOROLAC TROMETHAMINE 30 MG/ML IJ SOLN
INTRAMUSCULAR | Status: AC
  Filled 2014-08-28: qty 1

## 2014-08-28 MED ORDER — LIDOCAINE HCL (CARDIAC) 20 MG/ML IV SOLN
INTRAVENOUS | Status: DC | PRN
  Administered 2014-08-28: 60 mg via INTRAVENOUS

## 2014-08-28 MED ORDER — GLYCOPYRROLATE 0.2 MG/ML IJ SOLN
0.2000 mg | Freq: Once | INTRAMUSCULAR | Status: AC | PRN
  Administered 2014-08-28: 0.2 mg via INTRAVENOUS

## 2014-08-28 MED ORDER — DEXAMETHASONE SODIUM PHOSPHATE 4 MG/ML IJ SOLN
INTRAMUSCULAR | Status: DC | PRN
  Administered 2014-08-28: 10 mg via INTRAVENOUS

## 2014-08-28 MED ORDER — OXYCODONE-ACETAMINOPHEN 5-325 MG PO TABS: 2.0000 | ORAL_TABLET | ORAL | Status: DC | PRN

## 2014-08-28 MED ORDER — KETOROLAC TROMETHAMINE 30 MG/ML IJ SOLN
30.0000 mg | Freq: Once | INTRAMUSCULAR | Status: AC | PRN
  Administered 2014-08-28: 30 mg via INTRAVENOUS

## 2014-08-28 MED ORDER — CEFAZOLIN SODIUM-DEXTROSE 2-3 GM-% IV SOLR
INTRAVENOUS | Status: AC
  Filled 2014-08-28: qty 50

## 2014-08-28 MED ORDER — BUPIVACAINE HCL (PF) 0.25 % IJ SOLN
INTRAMUSCULAR | Status: DC | PRN
  Administered 2014-08-28: 20 mL

## 2014-08-28 MED ORDER — MIDAZOLAM HCL 2 MG/2ML IJ SOLN
1.0000 mg | INTRAMUSCULAR | Status: DC | PRN
  Administered 2014-08-28 (×2): 1 mg via INTRAVENOUS

## 2014-08-28 MED ORDER — OXYCODONE HCL 5 MG PO TABS
5.0000 mg | ORAL_TABLET | Freq: Once | ORAL | Status: AC
  Administered 2014-08-28: 5 mg via ORAL

## 2014-08-28 MED ORDER — HYDROMORPHONE HCL 1 MG/ML IJ SOLN
INTRAMUSCULAR | Status: AC
  Filled 2014-08-28: qty 1

## 2014-08-28 MED ORDER — OXYCODONE HCL 5 MG PO TABS
ORAL_TABLET | ORAL | Status: AC
  Filled 2014-08-28: qty 1

## 2014-08-28 MED ORDER — FENTANYL CITRATE (PF) 100 MCG/2ML IJ SOLN
25.0000 ug | INTRAMUSCULAR | Status: DC | PRN
  Administered 2014-08-28 (×2): 50 ug via INTRAVENOUS
  Administered 2014-08-28 (×2): 25 ug via INTRAVENOUS

## 2014-08-28 SURGICAL SUPPLY — 72 items
BANDAGE ELASTIC 3 VELCRO ST LF (GAUZE/BANDAGES/DRESSINGS) ×6 IMPLANT
BANDAGE ELASTIC 4 VELCRO ST LF (GAUZE/BANDAGES/DRESSINGS) IMPLANT
BANDAGE ELASTIC 6 VELCRO ST LF (GAUZE/BANDAGES/DRESSINGS) ×3 IMPLANT
BLADE CLIPPER SURG (BLADE) IMPLANT
BLADE SURG 15 STRL LF DISP TIS (BLADE) ×3 IMPLANT
BLADE SURG 15 STRL SS (BLADE) ×6
BNDG CONFORM 3 STRL LF (GAUZE/BANDAGES/DRESSINGS) ×3 IMPLANT
BNDG GAUZE ELAST 4 BULKY (GAUZE/BANDAGES/DRESSINGS) ×3 IMPLANT
BRUSH SCRUB EZ PLAIN DRY (MISCELLANEOUS) ×3 IMPLANT
CANISTER SUCT 1200ML W/VALVE (MISCELLANEOUS) ×3 IMPLANT
CLOSURE WOUND 1/2 X4 (GAUZE/BANDAGES/DRESSINGS)
CORDS BIPOLAR (ELECTRODE) ×3 IMPLANT
COVER BACK TABLE 60X90IN (DRAPES) ×3 IMPLANT
COVER MAYO STAND STRL (DRAPES) ×3 IMPLANT
CUFF TOURN SGL LL 18 NRW (TOURNIQUET CUFF) ×6 IMPLANT
CUFF TOURNIQUET SINGLE 18IN (TOURNIQUET CUFF) IMPLANT
DECANTER SPIKE VIAL GLASS SM (MISCELLANEOUS) IMPLANT
DRAPE EXTREMITY T 121X128X90 (DRAPE) ×3 IMPLANT
DRAPE INCISE IOBAN 66X45 STRL (DRAPES) IMPLANT
DRAPE SURG 17X23 STRL (DRAPES) ×3 IMPLANT
DRSG EMULSION OIL 3X3 NADH (GAUZE/BANDAGES/DRESSINGS) ×3 IMPLANT
EVACUATOR 1/8 PVC DRAIN (DRAIN) IMPLANT
EVACUATOR 3/16  PVC DRAIN (DRAIN)
EVACUATOR 3/16 PVC DRAIN (DRAIN) IMPLANT
EVACUATOR SILICONE 100CC (DRAIN) IMPLANT
GAUZE SPONGE 4X4 12PLY STRL (GAUZE/BANDAGES/DRESSINGS) ×3 IMPLANT
GAUZE SPONGE 4X4 16PLY XRAY LF (GAUZE/BANDAGES/DRESSINGS) IMPLANT
GAUZE XEROFORM 1X8 LF (GAUZE/BANDAGES/DRESSINGS) IMPLANT
GLOVE BIO SURGEON STRL SZ8 (GLOVE) ×3 IMPLANT
GLOVE BIOGEL M STRL SZ7.5 (GLOVE) ×3 IMPLANT
GLOVE SS BIOGEL STRL SZ 8 (GLOVE) ×2 IMPLANT
GLOVE SUPERSENSE BIOGEL SZ 8 (GLOVE) ×4
GOWN STRL REUS W/ TWL LRG LVL3 (GOWN DISPOSABLE) ×1 IMPLANT
GOWN STRL REUS W/ TWL XL LVL3 (GOWN DISPOSABLE) ×1 IMPLANT
GOWN STRL REUS W/TWL LRG LVL3 (GOWN DISPOSABLE) ×2
GOWN STRL REUS W/TWL XL LVL3 (GOWN DISPOSABLE) ×2
LOOP VESSEL MAXI BLUE (MISCELLANEOUS) IMPLANT
NEEDLE HYPO 22GX1.5 SAFETY (NEEDLE) IMPLANT
NEEDLE HYPO 25X1 1.5 SAFETY (NEEDLE) ×3 IMPLANT
NS IRRIG 1000ML POUR BTL (IV SOLUTION) ×3 IMPLANT
PACK BASIN DAY SURGERY FS (CUSTOM PROCEDURE TRAY) ×3 IMPLANT
PAD CAST 3X4 CTTN HI CHSV (CAST SUPPLIES) ×2 IMPLANT
PADDING CAST ABS 4INX4YD NS (CAST SUPPLIES) ×2
PADDING CAST ABS COTTON 4X4 ST (CAST SUPPLIES) ×1 IMPLANT
PADDING CAST COTTON 3X4 STRL (CAST SUPPLIES) ×4
SHEET MEDIUM DRAPE 40X70 STRL (DRAPES) IMPLANT
SPLINT FAST PLASTER 5X30 (CAST SUPPLIES)
SPLINT FIBERGLASS 4X30 (CAST SUPPLIES) ×3 IMPLANT
SPLINT PLASTER CAST FAST 5X30 (CAST SUPPLIES) IMPLANT
SPLINT PLASTER CAST XFAST 3X15 (CAST SUPPLIES) IMPLANT
SPLINT PLASTER XTRA FASTSET 3X (CAST SUPPLIES)
SPONGE LAP 4X18 X RAY DECT (DISPOSABLE) IMPLANT
STOCKINETTE 4X48 STRL (DRAPES) ×3 IMPLANT
STOCKINETTE SYNTHETIC 3 UNSTER (CAST SUPPLIES) ×3 IMPLANT
STRIP CLOSURE SKIN 1/2X4 (GAUZE/BANDAGES/DRESSINGS) IMPLANT
SUCTION FRAZIER TIP 10 FR DISP (SUCTIONS) ×3 IMPLANT
SUT BONE WAX W31G (SUTURE) IMPLANT
SUT FIBERWIRE 2-0 18 17.9 3/8 (SUTURE)
SUT PROLENE 4 0 PS 2 18 (SUTURE) ×6 IMPLANT
SUT VIC AB 3-0 FS2 27 (SUTURE) IMPLANT
SUT VIC AB 4-0 P-3 18XBRD (SUTURE) IMPLANT
SUT VIC AB 4-0 P3 18 (SUTURE)
SUTURE FIBERWR 2-0 18 17.9 3/8 (SUTURE) IMPLANT
SYR BULB 3OZ (MISCELLANEOUS) ×3 IMPLANT
SYR CONTROL 10ML LL (SYRINGE) ×3 IMPLANT
TAPE SURG TRANSPORE 1 IN (GAUZE/BANDAGES/DRESSINGS) ×1 IMPLANT
TAPE SURGICAL TRANSPORE 1 IN (GAUZE/BANDAGES/DRESSINGS) ×2
TOWEL OR 17X24 6PK STRL BLUE (TOWEL DISPOSABLE) ×6 IMPLANT
TOWEL OR NON WOVEN STRL DISP B (DISPOSABLE) ×3 IMPLANT
TUBE CONNECTING 20'X1/4 (TUBING) ×1
TUBE CONNECTING 20X1/4 (TUBING) ×2 IMPLANT
UNDERPAD 30X30 (UNDERPADS AND DIAPERS) ×3 IMPLANT

## 2014-08-28 NOTE — Anesthesia Preprocedure Evaluation (Signed)
Anesthesia Evaluation  Patient identified by MRN, date of birth, ID band Patient awake    Reviewed: Allergy & Precautions, NPO status , Patient's Chart, lab work & pertinent test results  Airway Mallampati: II  TM Distance: >3 FB Neck ROM: Full    Dental no notable dental hx.    Pulmonary Current Smoker,  breath sounds clear to auscultation  Pulmonary exam normal       Cardiovascular negative cardio ROS Normal cardiovascular examRhythm:Regular Rate:Normal     Neuro/Psych negative neurological ROS  negative psych ROS   GI/Hepatic negative GI ROS, Neg liver ROS,   Endo/Other  negative endocrine ROS  Renal/GU negative Renal ROS  negative genitourinary   Musculoskeletal negative musculoskeletal ROS (+)   Abdominal   Peds negative pediatric ROS (+)  Hematology  (+) anemia ,   Anesthesia Other Findings   Reproductive/Obstetrics negative OB ROS                             Anesthesia Physical Anesthesia Plan  ASA: II  Anesthesia Plan: General   Post-op Pain Management:    Induction: Intravenous  Airway Management Planned: LMA  Additional Equipment:   Intra-op Plan:   Post-operative Plan: Extubation in OR  Informed Consent: I have reviewed the patients History and Physical, chart, labs and discussed the procedure including the risks, benefits and alternatives for the proposed anesthesia with the patient or authorized representative who has indicated his/her understanding and acceptance.   Dental advisory given  Plan Discussed with: CRNA and Surgeon  Anesthesia Plan Comments:         Anesthesia Quick Evaluation

## 2014-08-28 NOTE — Op Note (Signed)
NAMECORTNEY, Leah Herrera NO.:  [id number]  MEDICAL RECORD NO.:  [id number]  LOCATION:  [facility]                        FACILITY:  [facility]  PHYSICIAN:  Satira Anis. Manuela Halbur, M.D.DATE OF BIRTH:  04-Dec-1975  DATE OF PROCEDURE: DATE OF DISCHARGE:  08/28/2014                              OPERATIVE REPORT   PREOPERATIVE DIAGNOSIS:  Chronic ulnar nerve compression, right elbow.  POSTOPERATIVE DIAGNOSIS:  Chronic ulnar nerve compression, right elbow.  PROCEDURE: 1. Right ulnar nerve decompression and anterior transposition at the     elbow. 2. Flexor pronator fascia release/lengthening, right elbow and     forearm.  SURGEON:  Satira Anis. Amedeo Plenty, M.D.  ASSISTANT:  None.  COMPLICATIONS:  None.  ANESTHESIA:  General.  TOURNIQUET TIME:  Less than an hour.  INDICATIONS FOR PROCEDURE:  This patient is a 39 year old female with severe ulnar neuropathy subjectively and objectively.  She has positive studies indicative of her abnormality and understands risks and benefits of surgery including risk of infection, bleeding, anesthesia, damage to normal structures, and failure of surgery to accomplish its intended goals of relieving symptoms and restoring function.  With this in mind, she desires to proceed.  All questions have been encouraged and answered preoperatively.  OPERATIVE PROCEDURE:  The patient was seen by myself and Anesthesia, taken to the operative arena, underwent a smooth induction of general anesthesia, prepped and draped in usual sterile fashion with Betadine scrub and paint supervised by myself.  Sterile field was secured.  Time- out called.  Pre and postop check was complete, and antibiotics were given prophylactically.  At this time, we inflated a sterile tourniquet, which I applied.  I made a curvilinear posterior medial incision. Dissection was carried down to medial antebrachial cutaneous nerve branch, was not in the vicinity of the dissection, but more  distal. Thus, it was not aggressively dissected.  I swept a plane between the subcu and fascia, and then released the ulnar nerve proximally.  The ulnar nerve was released about the arcade of Struthers, medial intermuscular septum, cubital tunnel Osborne's ligament, and the 2 heads of the FCU, superficial and deep.  It was then mobilized on its epineural plexus of vessels without difficulty very carefully and cautiously.  I then excised the medial intermuscular septum, transposed the nerve, and all looked quite well.  We then performed a fascial release with a strip of fascia being used for purposes of securing the nerve from frank subluxation.  The fascia was released.  I then performed a fascial released distally, so the nerve would sit nicely tension free without difficulty.  This is a fascial Z-type lengthening at the elbow region. Following this, we irrigated copiously and then deflated the tourniquet. Hemostasis was secured.  Cubital tunnel was closed with Vicryl.  The fascial sling was placed against the subcu carefully protecting the cutaneous nerve branches and following this, the subcu was closed with 3- 0 Vicryl and skin edge with Prolene.  The skin edges were closed with subcuticular Prolene.  There were no complicating features.  Once this was complete, the patient then underwent a very careful and cautious approach to the extremity with irrigation and Steri-Strips application followed by 20 mL  of local anesthetic (Sensorcaine with epinephrine placement with postop analgesia).  The patient tolerated this well.  There were no complicating features.  The tourniquet was deflated prior to closure and hemostasis was secured with bipolar electrocautery.  I should note.  The patient tolerated the procedure well.  There were no complicating features.  We are going to monitor her condition and discharge her home. She will be discharged home on Percocet.  We will see her back in  the office in 12 days and proceed according to our standard postop algorithm.  These notes have been discussed, and all questions have been encouraged and answered.     Satira Anis. Amedeo Plenty, M.D.   ______________________________ Satira Anis. Amedeo Plenty, M.D.    [facility]  D:  08/28/2014  T:  08/28/2014  Job:  [id number]

## 2014-08-28 NOTE — Op Note (Deleted)
Leah Herrera, Leah Herrera NO.:  [id number]  MEDICAL RECORD NO.:  [id number]  LOCATION:  [facility]                        FACILITY:  [facility]  PHYSICIAN:  Satira Anis. Shardai Star, M.D.DATE OF BIRTH:  1975-09-14  DATE OF PROCEDURE: DATE OF DISCHARGE:  08/28/2014                              OPERATIVE REPORT   PREOPERATIVE DIAGNOSIS:  Chronic ulnar nerve compression, right elbow.  POSTOPERATIVE DIAGNOSIS:  Chronic ulnar nerve compression, right elbow.  PROCEDURE: 1. Right ulnar nerve decompression and anterior transposition at the     elbow. 2. Flexor pronator fascia release/lengthening, right elbow and     forearm.  SURGEON:  Satira Anis. Amedeo Plenty, M.D.  ASSISTANT:  None.  COMPLICATIONS:  None.  ANESTHESIA:  General.  TOURNIQUET TIME:  Less than an hour.  INDICATIONS FOR PROCEDURE:  This patient is a 39 year old female with severe ulnar neuropathy subjectively and objectively.  She has positive studies indicative of her abnormality and understands risks and benefits of surgery including risk of infection, bleeding, anesthesia, damage to normal structures, and failure of surgery to accomplish its intended goals of relieving symptoms and restoring function.  With this in mind, she desires to proceed.  All questions have been encouraged and answered preoperatively.  OPERATIVE PROCEDURE:  The patient was seen by myself and Anesthesia, taken to the operative arena, underwent a smooth induction of general anesthesia, prepped and draped in usual sterile fashion with Betadine scrub and paint supervised by myself.  Sterile field was secured.  Time- out called.  Pre and postop check was complete, and antibiotics were given prophylactically.  At this time, we inflated a sterile tourniquet, which I applied.  I made a curvilinear posterior medial incision. Dissection was carried down to medial antebrachial cutaneous nerve branch, was not in the vicinity of the dissection, but more  distal. Thus, it was not aggressively dissected.  I swept a plane between the subcu and fascia, and then released the ulnar nerve proximally.  The ulnar nerve was released about the arcade of Struthers, medial intermuscular septum, cubital tunnel Osborne's ligament, and the 2 heads of the FCU, superficial and deep.  It was then mobilized on its epineural plexus of vessels without difficulty very carefully and cautiously.  I then excised the medial intermuscular septum, transposed the nerve, and all looked quite well.  We then performed a fascial release with a strip of fascia being used for purposes of securing the nerve from frank subluxation.  The fascia was released.  I then performed a fascial released distally, so the nerve would sit nicely tension free without difficulty.  This is a fascial Z-type lengthening at the elbow region. Following this, we irrigated copiously and then deflated the tourniquet. Hemostasis was secured.  Cubital tunnel was closed with Vicryl.  The fascial sling was placed against the subcu carefully protecting the cutaneous nerve branches and following this, the subcu was closed with 3- 0 Vicryl and skin edge with Prolene.  The skin edges were closed with subcuticular Prolene.  There were no complicating features.  Once this was complete, the patient then underwent a very careful and cautious approach to the extremity with irrigation and Steri-Strips application followed by 20 mL  of local anesthetic (Sensorcaine with epinephrine placement with postop analgesia).  The patient tolerated this well.  There were no complicating features.  The tourniquet was deflated prior to closure and hemostasis was secured with bipolar electrocautery.  I should note.  The patient tolerated the procedure well.  There were no complicating features.  We are going to monitor her condition and discharge her home. She will be discharged home on Percocet.  We will see her back in  the office in 12 days and proceed according to our standard postop algorithm.  These notes have been discussed, and all questions have been encouraged and answered.     Satira Anis. Amedeo Plenty, M.D.   ______________________________ Satira Anis. Amedeo Plenty, M.D.    [facility]  D:  08/28/2014  T:  08/28/2014  Job:  [id number]

## 2014-08-28 NOTE — Op Note (Deleted)
NAMEGUYNELL, KLEIBER NO.:  [id number]  MEDICAL RECORD NO.:  [id number]  LOCATION:  [facility]                        FACILITY:  [facility]  PHYSICIAN:  Satira Anis. Kyri Dai, M.D.DATE OF BIRTH:  1975/12/24  DATE OF PROCEDURE: DATE OF DISCHARGE:  08/28/2014                              OPERATIVE REPORT   PREOPERATIVE DIAGNOSIS:  Chronic ulnar nerve compression, right elbow.  POSTOPERATIVE DIAGNOSIS:  Chronic ulnar nerve compression, right elbow.  PROCEDURE: 1. Right ulnar nerve decompression and anterior transposition at the     elbow. 2. Flexor pronator fascia release/lengthening, right elbow and     forearm.  SURGEON:  Satira Anis. Amedeo Plenty, M.D.  ASSISTANT:  None.  COMPLICATIONS:  None.  ANESTHESIA:  General.  TOURNIQUET TIME:  Less than an hour.  INDICATIONS FOR PROCEDURE:  This patient is a 39 year old female with severe ulnar neuropathy subjectively and objectively.  She has positive studies indicative of her abnormality and understands risks and benefits of surgery including risk of infection, bleeding, anesthesia, damage to normal structures, and failure of surgery to accomplish its intended goals of relieving symptoms and restoring function.  With this in mind, she desires to proceed.  All questions have been encouraged and answered preoperatively.  OPERATIVE PROCEDURE:  The patient was seen by myself and Anesthesia, taken to the operative arena, underwent a smooth induction of general anesthesia, prepped and draped in usual sterile fashion with Betadine scrub and paint supervised by myself.  Sterile field was secured.  Time- out called.  Pre and postop check was complete, and antibiotics were given prophylactically.  At this time, we inflated a sterile tourniquet, which I applied.  I made a curvilinear posterior medial incision. Dissection was carried down to medial antebrachial cutaneous nerve branch, was not in the vicinity of the dissection, but more  distal. Thus, it was not aggressively dissected.  I swept a plane between the subcu and fascia, and then released the ulnar nerve proximally.  The ulnar nerve was released about the arcade of Struthers, medial intermuscular septum, cubital tunnel Osborne's ligament, and the 2 heads of the FCU, superficial and deep.  It was then mobilized on its epineural plexus of vessels without difficulty very carefully and cautiously.  I then excised the medial intermuscular septum, transposed the nerve, and all looked quite well.  We then performed a fascial release with a strip of fascia being used for purposes of securing the nerve from frank subluxation.  The fascia was released.  I then performed a fascial released distally, so the nerve would sit nicely tension free without difficulty.  This is a fascial Z-type lengthening at the elbow region. Following this, we irrigated copiously and then deflated the tourniquet. Hemostasis was secured.  Cubital tunnel was closed with Vicryl.  The fascial sling was placed against the subcu carefully protecting the cutaneous nerve branches and following this, the subcu was closed with 3- 0 Vicryl and skin edge with Prolene.  The skin edges were closed with subcuticular Prolene.  There were no complicating features.  Once this was complete, the patient then underwent a very careful and cautious approach to the extremity with irrigation and Steri-Strips application followed by 20 mL  of local anesthetic (Sensorcaine with epinephrine placement with postop analgesia).  The patient tolerated this well.  There were no complicating features.  The tourniquet was deflated prior to closure and hemostasis was secured with bipolar electrocautery.  I should note.  The patient tolerated the procedure well.  There were no complicating features.  We are going to monitor her condition and discharge her home. She will be discharged home on Percocet.  We will see her back in  the office in 12 days and proceed according to our standard postop algorithm.  These notes have been discussed, and all questions have been encouraged and answered.     Satira Anis. Amedeo Plenty, M.D.   ______________________________ Satira Anis. Amedeo Plenty, M.D.    [facility]  D:  08/28/2014  T:  08/28/2014  Job:  [id number]

## 2014-08-28 NOTE — Anesthesia Postprocedure Evaluation (Signed)
  Anesthesia Post-op Note  Patient: Leah Herrera  Procedure(s) Performed: Procedure(s) (LRB): RIGHT ULNAR NERVE DECOMPRESSION/ AND ANTERIOR TRANSPOSITION (Right)  Patient Location: PACU  Anesthesia Type: General  Level of Consciousness: awake and alert   Airway and Oxygen Therapy: Patient Spontanous Breathing  Post-op Pain: mild  Post-op Assessment: Post-op Vital signs reviewed, Patient's Cardiovascular Status Stable, Respiratory Function Stable, Patent Airway and No signs of Nausea or vomiting  Last Vitals:  Filed Vitals:   08/28/14 1200  BP:   Pulse:   Temp: 36.2 C  Resp: 18    Post-op Vital Signs: stable   Complications: No apparent anesthesia complications

## 2014-08-28 NOTE — Discharge Instructions (Signed)
We recommend that you to take vitamin C 1000 mg a day to promote healing. We also recommend that if you require  pain medicine that you take a stool softener to prevent constipation as most pain medicines will have constipation side effects. We recommend either Peri-Colace or Senokot and recommend that you also consider adding MiraLAX to prevent the constipation affects from pain medicine if you are required to use them. These medicines are over the counter and maybe purchased at a local pharmacy. A cup of yogurt and a probiotic can also be helpful during the recovery process as the medicines can disrupt your intestinal environment. Keep bandage clean and dry.  Call for any problems.  No smoking.  Criteria for driving a car: you should be off your pain medicine for 7-8 hours, able to drive one handed(confident), thinking clearly and feeling able in your judgement to drive. Continue elevation as it will decrease swelling.  If instructed by MD move your fingers within the confines of the bandage/splint.  Use ice if instructed by your MD. Call immediately for any sudden loss of feeling in your hand/arm or change in functional abilities of the extremity.   Post Anesthesia Home Care Instructions  Activity: Get plenty of rest for the remainder of the day. A responsible adult should stay with you for 24 hours following the procedure.  For the next 24 hours, DO NOT: -Drive a car -Paediatric nurse -Drink alcoholic beverages -Take any medication unless instructed by your physician -Make any legal decisions or sign important papers.  Meals: Start with liquid foods such as gelatin or soup. Progress to regular foods as tolerated. Avoid greasy, spicy, heavy foods. If nausea and/or vomiting occur, drink only clear liquids until the nausea and/or vomiting subsides. Call your physician if vomiting continues.  Special Instructions/Symptoms: Your throat may feel dry or sore from the anesthesia or the breathing  tube placed in your throat during surgery. If this causes discomfort, gargle with warm salt water. The discomfort should disappear within 24 hours.  If you had a scopolamine patch placed behind your ear for the management of post- operative nausea and/or vomiting:  1. The medication in the patch is effective for 72 hours, after which it should be removed.  Wrap patch in a tissue and discard in the trash. Wash hands thoroughly with soap and water. 2. You may remove the patch earlier than 72 hours if you experience unpleasant side effects which may include dry mouth, dizziness or visual disturbances. 3. Avoid touching the patch. Wash your hands with soap and water after contact with the patch.

## 2014-08-28 NOTE — Op Note (Signed)
See dictation#[id number] Amedeo Plenty MD

## 2014-08-28 NOTE — Anesthesia Procedure Notes (Signed)
Procedure Name: LMA Insertion Date/Time: 08/28/2014 10:45 AM Performed by: Lyndee Leo Pre-anesthesia Checklist: Patient identified, Emergency Drugs available, Suction available and Patient being monitored Patient Re-evaluated:Patient Re-evaluated prior to inductionOxygen Delivery Method: Circle System Utilized Preoxygenation: Pre-oxygenation with 100% oxygen Intubation Type: IV induction Ventilation: Mask ventilation without difficulty LMA: LMA inserted LMA Size: 4.0 Number of attempts: 1 Airway Equipment and Method: Bite block Placement Confirmation: positive ETCO2 Tube secured with: Tape Dental Injury: Teeth and Oropharynx as per pre-operative assessment

## 2014-08-28 NOTE — Transfer of Care (Signed)
Immediate Anesthesia Transfer of Care Note  Patient: Leah Herrera  Procedure(s) Performed: Procedure(s): RIGHT ULNAR NERVE DECOMPRESSION/ AND ANTERIOR TRANSPOSITION (Right)  Patient Location: PACU  Anesthesia Type:General  Level of Consciousness: awake, sedated and patient cooperative  Airway & Oxygen Therapy: Patient Spontanous Breathing and Patient connected to face mask oxygen  Post-op Assessment: Report given to RN and Post -op Vital signs reviewed and stable  Post vital signs: Reviewed and stable  Last Vitals:  Filed Vitals:   08/28/14 1003  BP: 117/72  Pulse: 64  Temp: 36.8 C  Resp: 20    Complications: No apparent anesthesia complications

## 2014-08-28 NOTE — H&P (Signed)
Leah Herrera is an 39 y.o. female.   Chief Complaint: right cubital tunnel syndrome HPI: patient presents withright ulnar nerve compression. patient presents for decompression and transposition as necessary.  Patient presents for evaluation and treatment of the of their upper extremity predicament. The patient denies neck, back, chest or  abdominal pain. The patient notes that they have no lower extremity problems. The patients primary complaint is noted. We are planning surgical care pathway for the upper extremity. Past Medical History  Diagnosis Date  . Depression   . Anemia     chronic anemia, recieves iron infusions periodically  . Arthritis   . Cubital tunnel syndrome on right     Past Surgical History  Procedure Laterality Date  . Cholecystectomy    . Gastric bypass  2008  . Orif tibia & fibula fractures      History reviewed. No pertinent family history. Social History:  reports that she has been smoking.  She does not have any smokeless tobacco history on file. She reports that she drinks alcohol. She reports that she does not use illicit drugs.  Allergies:  Allergies  Allergen Reactions  . Penicillins Anaphylaxis    Medications Prior to Admission  Medication Sig Dispense Refill  . celecoxib (CELEBREX) 200 MG capsule Take 200 mg by mouth 2 (two) times daily.    . Niacin (VITAMIN B-3 PO) Take by mouth.    . vitamin B-12 (CYANOCOBALAMIN) 1000 MCG tablet Take 1,000 mcg by mouth daily.    Marland Kitchen albuterol (PROVENTIL HFA;VENTOLIN HFA) 108 (90 BASE) MCG/ACT inhaler Inhale into the lungs every 6 (six) hours as needed for wheezing or shortness of breath.      No results found for this or any previous visit (from the past 48 hour(s)). No results found.  Review of Systems  HENT: Negative.   Eyes: Negative.   Respiratory: Negative.   Cardiovascular: Negative.   Gastrointestinal: Negative.   Genitourinary: Negative.   Neurological: Negative.     Blood pressure 117/72,  pulse 64, temperature 98.2 F (36.8 C), temperature source Oral, resp. rate 20, height 5' 4.5" (1.638 m), weight 66.679 kg (147 lb), last menstrual period 08/22/2014, SpO2 100 %. Physical Exam  Positive right ulnar nerve compression symptoms with positive Tinel's elbow flexion test and scratch test  No evidence of infection or dystrophy no evidence of compartment syndrome.  The patient is alert and oriented in no acute distress. The patient complains of pain in the affected upper extremity.  The patient is noted to have a normal HEENT exam. Lung fields show equal chest expansion and no shortness of breath. Abdomen exam is nontender without distention. Lower extremity examination does not show any fracture dislocation or blood clot symptoms. Pelvis is stable and the neck and back are stable and nontender. Assessment/Plan  We'll plan to proceed with right ulnar nerve decompression and anterior transposition as necessary I discussed with the patient risk and benefits timeframe duration of recovery and other issues including pain management algorithms postoperatively.  We are planning surgery for your upper extremity. The risk and benefits of surgery to include risk of bleeding, infection, anesthesia,  damage to normal structures and failure of the surgery to accomplish its intended goals of relieving symptoms and restoring function have been discussed in detail. With this in mind we plan to proceed. I have specifically discussed with the patient the pre-and postoperative regime and the dos and don'ts and risk and benefits in great detail. Risk and benefits of surgery  also include risk of dystrophy(CRPS), chronic nerve pain, failure of the healing process to go onto completion and other inherent risks of surgery The relavent the pathophysiology of the disease/injury process, as well as the alternatives for treatment and postoperative course of action has been discussed in great detail with the patient  who desires to proceed.  We will do everything in our power to help you (the patient) restore function to the upper extremity. It is a pleasure to see this patient today.  Paulene Floor 08/28/2014, 10:22 AM

## 2014-08-31 ENCOUNTER — Encounter (HOSPITAL_BASED_OUTPATIENT_CLINIC_OR_DEPARTMENT_OTHER): Payer: Self-pay | Admitting: Orthopedic Surgery

## 2014-11-12 ENCOUNTER — Other Ambulatory Visit: Payer: 59

## 2014-11-13 ENCOUNTER — Inpatient Hospital Stay: Payer: 59

## 2014-11-13 ENCOUNTER — Inpatient Hospital Stay: Payer: 59 | Attending: Oncology

## 2014-11-13 DIAGNOSIS — R5383 Other fatigue: Secondary | ICD-10-CM | POA: Insufficient documentation

## 2014-11-13 DIAGNOSIS — M129 Arthropathy, unspecified: Secondary | ICD-10-CM | POA: Insufficient documentation

## 2014-11-13 DIAGNOSIS — Z79899 Other long term (current) drug therapy: Secondary | ICD-10-CM | POA: Insufficient documentation

## 2014-11-13 DIAGNOSIS — F329 Major depressive disorder, single episode, unspecified: Secondary | ICD-10-CM | POA: Insufficient documentation

## 2014-11-13 DIAGNOSIS — D509 Iron deficiency anemia, unspecified: Secondary | ICD-10-CM | POA: Insufficient documentation

## 2014-11-13 DIAGNOSIS — Z87891 Personal history of nicotine dependence: Secondary | ICD-10-CM | POA: Insufficient documentation

## 2014-11-13 DIAGNOSIS — Z9884 Bariatric surgery status: Secondary | ICD-10-CM | POA: Insufficient documentation

## 2014-11-13 DIAGNOSIS — R531 Weakness: Secondary | ICD-10-CM | POA: Insufficient documentation

## 2014-11-13 LAB — CBC WITH DIFFERENTIAL/PLATELET
BASOS ABS: 0.1 10*3/uL (ref 0–0.1)
Basophils Relative: 1 %
Eosinophils Absolute: 0.3 10*3/uL (ref 0–0.7)
Eosinophils Relative: 3 %
HCT: 38.3 % (ref 35.0–47.0)
Hemoglobin: 12.7 g/dL (ref 12.0–16.0)
LYMPHS PCT: 21 %
Lymphs Abs: 1.8 10*3/uL (ref 1.0–3.6)
MCH: 29.4 pg (ref 26.0–34.0)
MCHC: 33.2 g/dL (ref 32.0–36.0)
MCV: 88.5 fL (ref 80.0–100.0)
Monocytes Absolute: 0.8 10*3/uL (ref 0.2–0.9)
Monocytes Relative: 9 %
NEUTROS ABS: 5.7 10*3/uL (ref 1.4–6.5)
Neutrophils Relative %: 66 %
Platelets: 372 10*3/uL (ref 150–440)
RBC: 4.33 MIL/uL (ref 3.80–5.20)
RDW: 15.1 % — ABNORMAL HIGH (ref 11.5–14.5)
WBC: 8.7 10*3/uL (ref 3.6–11.0)

## 2014-11-13 LAB — IRON AND TIBC
Iron: 238 ug/dL — ABNORMAL HIGH (ref 28–170)
SATURATION RATIOS: 56 % — AB (ref 10.4–31.8)
TIBC: 424 ug/dL (ref 250–450)
UIBC: 186 ug/dL

## 2014-11-13 LAB — FERRITIN: Ferritin: 8 ng/mL — ABNORMAL LOW (ref 11–307)

## 2014-11-17 ENCOUNTER — Other Ambulatory Visit: Payer: Self-pay | Admitting: Nurse Practitioner

## 2014-11-17 ENCOUNTER — Encounter: Payer: Self-pay | Admitting: Oncology

## 2014-11-17 DIAGNOSIS — R1013 Epigastric pain: Secondary | ICD-10-CM

## 2014-11-17 DIAGNOSIS — Z9884 Bariatric surgery status: Secondary | ICD-10-CM

## 2014-11-17 DIAGNOSIS — R112 Nausea with vomiting, unspecified: Secondary | ICD-10-CM

## 2014-11-18 NOTE — Progress Notes (Unsigned)
This encounter was created in error - please disregard.

## 2014-11-20 ENCOUNTER — Telehealth: Payer: Self-pay | Admitting: *Deleted

## 2014-11-20 ENCOUNTER — Ambulatory Visit
Admission: RE | Admit: 2014-11-20 | Discharge: 2014-11-20 | Disposition: A | Discharge status: Home / Self Care | Payer: 59 | Source: Ambulatory Visit | Attending: Nurse Practitioner | Admitting: Nurse Practitioner

## 2014-11-20 DIAGNOSIS — Z9884 Bariatric surgery status: Secondary | ICD-10-CM | POA: Diagnosis not present

## 2014-11-20 DIAGNOSIS — R112 Nausea with vomiting, unspecified: Secondary | ICD-10-CM

## 2014-11-20 DIAGNOSIS — R1013 Epigastric pain: Secondary | ICD-10-CM

## 2014-11-20 NOTE — Telephone Encounter (Signed)
Called patient and informed her of lab results and told her that a scheduler should be calling her with an appt

## 2014-11-20 NOTE — Telephone Encounter (Signed)
Called to inquire about results form labs drawn last Friday and whether she is going to get treatment or not. Please return her call

## 2014-11-20 NOTE — Telephone Encounter (Signed)
hbg normal, but iron is low.  Recommend md visit and treatment with feraheme

## 2014-11-24 ENCOUNTER — Inpatient Hospital Stay: Payer: 59

## 2014-11-24 ENCOUNTER — Inpatient Hospital Stay (HOSPITAL_BASED_OUTPATIENT_CLINIC_OR_DEPARTMENT_OTHER): Payer: 59 | Admitting: Oncology

## 2014-11-24 VITALS — BP 123/86 | HR 76 | Temp 98.2°F | Wt 151.3 lb

## 2014-11-24 DIAGNOSIS — Z87891 Personal history of nicotine dependence: Secondary | ICD-10-CM

## 2014-11-24 DIAGNOSIS — Z79899 Other long term (current) drug therapy: Secondary | ICD-10-CM | POA: Diagnosis not present

## 2014-11-24 DIAGNOSIS — R5383 Other fatigue: Secondary | ICD-10-CM | POA: Diagnosis not present

## 2014-11-24 DIAGNOSIS — D509 Iron deficiency anemia, unspecified: Secondary | ICD-10-CM

## 2014-11-24 DIAGNOSIS — R531 Weakness: Secondary | ICD-10-CM

## 2014-11-24 DIAGNOSIS — M129 Arthropathy, unspecified: Secondary | ICD-10-CM

## 2014-11-24 DIAGNOSIS — F329 Major depressive disorder, single episode, unspecified: Secondary | ICD-10-CM

## 2014-11-24 DIAGNOSIS — Z9884 Bariatric surgery status: Secondary | ICD-10-CM

## 2014-11-24 MED ORDER — SODIUM CHLORIDE 0.9 % IV SOLN
Freq: Once | INTRAVENOUS | Status: AC
  Administered 2014-11-24: 15:00:00 via INTRAVENOUS
  Filled 2014-11-24: qty 1000

## 2014-11-24 MED ORDER — SODIUM CHLORIDE 0.9 % IV SOLN
510.0000 mg | Freq: Once | INTRAVENOUS | Status: AC
  Administered 2014-11-24: 510 mg via INTRAVENOUS
  Filled 2014-11-24: qty 17

## 2014-11-24 NOTE — Progress Notes (Signed)
Patient here for follow up no complaints today. 

## 2014-11-26 ENCOUNTER — Ambulatory Visit: Payer: 59 | Admitting: Oncology

## 2014-11-26 ENCOUNTER — Encounter: Payer: Self-pay | Admitting: *Deleted

## 2014-11-26 ENCOUNTER — Ambulatory Visit: Payer: 59

## 2014-11-27 ENCOUNTER — Ambulatory Visit
Admission: RE | Admit: 2014-11-27 | Discharge: 2014-11-27 | Disposition: A | Discharge status: Home / Self Care | Payer: Commercial Managed Care - HMO | Source: Ambulatory Visit | Attending: Gastroenterology | Admitting: Gastroenterology

## 2014-11-27 ENCOUNTER — Inpatient Hospital Stay: Payer: 59

## 2014-11-27 ENCOUNTER — Ambulatory Visit: Payer: Commercial Managed Care - HMO | Admitting: Anesthesiology

## 2014-11-27 ENCOUNTER — Encounter
Admission: RE | Disposition: A | Discharge status: Home / Self Care | Payer: Self-pay | Source: Ambulatory Visit | Attending: Gastroenterology

## 2014-11-27 ENCOUNTER — Encounter: Payer: Self-pay | Admitting: *Deleted

## 2014-11-27 VITALS — BP 105/71 | HR 80 | Resp 20

## 2014-11-27 DIAGNOSIS — Z9884 Bariatric surgery status: Secondary | ICD-10-CM | POA: Insufficient documentation

## 2014-11-27 DIAGNOSIS — Z79891 Long term (current) use of opiate analgesic: Secondary | ICD-10-CM | POA: Insufficient documentation

## 2014-11-27 DIAGNOSIS — R1013 Epigastric pain: Secondary | ICD-10-CM | POA: Diagnosis present

## 2014-11-27 DIAGNOSIS — M542 Cervicalgia: Secondary | ICD-10-CM | POA: Insufficient documentation

## 2014-11-27 DIAGNOSIS — K9589 Other complications of other bariatric procedure: Secondary | ICD-10-CM | POA: Insufficient documentation

## 2014-11-27 DIAGNOSIS — F42 Obsessive-compulsive disorder: Secondary | ICD-10-CM | POA: Insufficient documentation

## 2014-11-27 DIAGNOSIS — E559 Vitamin D deficiency, unspecified: Secondary | ICD-10-CM | POA: Diagnosis not present

## 2014-11-27 DIAGNOSIS — F17210 Nicotine dependence, cigarettes, uncomplicated: Secondary | ICD-10-CM | POA: Insufficient documentation

## 2014-11-27 DIAGNOSIS — G8929 Other chronic pain: Secondary | ICD-10-CM | POA: Insufficient documentation

## 2014-11-27 DIAGNOSIS — R140 Abdominal distension (gaseous): Secondary | ICD-10-CM | POA: Diagnosis not present

## 2014-11-27 DIAGNOSIS — D509 Iron deficiency anemia, unspecified: Secondary | ICD-10-CM

## 2014-11-27 DIAGNOSIS — Z7952 Long term (current) use of systemic steroids: Secondary | ICD-10-CM | POA: Diagnosis not present

## 2014-11-27 DIAGNOSIS — Y832 Surgical operation with anastomosis, bypass or graft as the cause of abnormal reaction of the patient, or of later complication, without mention of misadventure at the time of the procedure: Secondary | ICD-10-CM | POA: Diagnosis not present

## 2014-11-27 DIAGNOSIS — R112 Nausea with vomiting, unspecified: Secondary | ICD-10-CM | POA: Insufficient documentation

## 2014-11-27 DIAGNOSIS — Z79899 Other long term (current) drug therapy: Secondary | ICD-10-CM | POA: Insufficient documentation

## 2014-11-27 DIAGNOSIS — M545 Low back pain: Secondary | ICD-10-CM | POA: Diagnosis not present

## 2014-11-27 DIAGNOSIS — F329 Major depressive disorder, single episode, unspecified: Secondary | ICD-10-CM | POA: Insufficient documentation

## 2014-11-27 DIAGNOSIS — F909 Attention-deficit hyperactivity disorder, unspecified type: Secondary | ICD-10-CM | POA: Diagnosis not present

## 2014-11-27 DIAGNOSIS — K9189 Other postprocedural complications and disorders of digestive system: Secondary | ICD-10-CM | POA: Diagnosis not present

## 2014-11-27 DIAGNOSIS — K219 Gastro-esophageal reflux disease without esophagitis: Secondary | ICD-10-CM | POA: Insufficient documentation

## 2014-11-27 DIAGNOSIS — F419 Anxiety disorder, unspecified: Secondary | ICD-10-CM | POA: Diagnosis not present

## 2014-11-27 HISTORY — DX: Attention-deficit hyperactivity disorder, unspecified type: F90.9

## 2014-11-27 HISTORY — DX: Obsessive-compulsive disorder, unspecified: F42.9

## 2014-11-27 HISTORY — PX: ESOPHAGOGASTRODUODENOSCOPY (EGD) WITH PROPOFOL: SHX5813

## 2014-11-27 HISTORY — DX: Vitamin D deficiency, unspecified: E55.9

## 2014-11-27 LAB — POCT PREGNANCY, URINE: PREG TEST UR: NEGATIVE

## 2014-11-27 SURGERY — ESOPHAGOGASTRODUODENOSCOPY (EGD) WITH PROPOFOL
Anesthesia: General

## 2014-11-27 MED ORDER — PROPOFOL 10 MG/ML IV BOLUS
INTRAVENOUS | Status: DC | PRN
  Administered 2014-11-27: 50 mg via INTRAVENOUS
  Administered 2014-11-27: 30 mg via INTRAVENOUS

## 2014-11-27 MED ORDER — ONDANSETRON HCL 4 MG/2ML IJ SOLN: 4.0000 mg | Freq: Once | INTRAMUSCULAR | Status: DC

## 2014-11-27 MED ORDER — PROPOFOL 500 MG/50ML IV EMUL
INTRAVENOUS | Status: DC | PRN
  Administered 2014-11-27: 120 ug/kg/min via INTRAVENOUS

## 2014-11-27 MED ORDER — LIDOCAINE HCL (CARDIAC) 20 MG/ML IV SOLN
INTRAVENOUS | Status: DC | PRN
  Administered 2014-11-27: 25 mg via INTRAVENOUS

## 2014-11-27 MED ORDER — SODIUM CHLORIDE 0.9 % IV SOLN
Freq: Once | INTRAVENOUS | Status: AC
  Administered 2014-11-27: 20 mL/h via INTRAVENOUS
  Filled 2014-11-27: qty 1000

## 2014-11-27 MED ORDER — SODIUM CHLORIDE 0.9 % IV SOLN
510.0000 mg | Freq: Once | INTRAVENOUS | Status: AC
  Administered 2014-11-27: 510 mg via INTRAVENOUS
  Filled 2014-11-27: qty 17

## 2014-11-27 MED ORDER — SODIUM CHLORIDE 0.9 % IV SOLN
INTRAVENOUS | Status: DC
  Administered 2014-11-27: 1000 mL via INTRAVENOUS

## 2014-11-27 MED ORDER — SODIUM CHLORIDE 0.9 % IV SOLN
INTRAVENOUS | Status: DC
  Administered 2014-11-27: 10:00:00 via INTRAVENOUS

## 2014-11-27 MED ORDER — ONDANSETRON HCL 4 MG/2ML IJ SOLN
INTRAMUSCULAR | Status: AC
  Administered 2014-11-27: 4 mg
  Filled 2014-11-27: qty 2

## 2014-11-27 MED ORDER — FENTANYL CITRATE (PF) 100 MCG/2ML IJ SOLN
INTRAMUSCULAR | Status: DC | PRN
  Administered 2014-11-27: 50 ug via INTRAVENOUS

## 2014-11-27 NOTE — Anesthesia Postprocedure Evaluation (Signed)
  Anesthesia Post-op Note  Patient: Leah Herrera  Procedure(s) Performed: Procedure(s): ESOPHAGOGASTRODUODENOSCOPY (EGD) WITH PROPOFOL (N/A)  Anesthesia type:General  Patient location: PACU  Post pain: Pain level controlled  Post assessment: Post-op Vital signs reviewed, Patient's Cardiovascular Status Stable, Respiratory Function Stable, Patent Airway and No signs of Nausea or vomiting  Post vital signs: Reviewed and stable  Last Vitals:  Filed Vitals:   11/27/14 1100  BP: 155/94  Pulse: 58  Temp:   Resp: 21    Level of consciousness: awake, alert  and patient cooperative  Complications: No apparent anesthesia complications

## 2014-11-27 NOTE — Anesthesia Preprocedure Evaluation (Signed)
Anesthesia Evaluation  Patient identified by MRN, date of birth, ID band Patient awake    Reviewed: Allergy & Precautions, H&P , NPO status , Patient's Chart, lab work & pertinent test results  History of Anesthesia Complications Negative for: history of anesthetic complications  Airway Mallampati: II  TM Distance: >3 FB Neck ROM: full    Dental no notable dental hx. (+) Teeth Intact   Pulmonary neg shortness of breath, Current Smoker,    Pulmonary exam normal breath sounds clear to auscultation       Cardiovascular Exercise Tolerance: Good (-) Past MI negative cardio ROS Normal cardiovascular exam Rhythm:regular Rate:Normal     Neuro/Psych PSYCHIATRIC DISORDERS Anxiety Depression negative neurological ROS  negative psych ROS   GI/Hepatic Neg liver ROS, GERD  Controlled,  Endo/Other  negative endocrine ROS  Renal/GU negative Renal ROS  negative genitourinary   Musculoskeletal   Abdominal   Peds  Hematology negative hematology ROS (+)   Anesthesia Other Findings Past Medical History:   Anemia associated with cancer treated with ery* 03/31/2011    Anemia                                                       Anxiety                                                      ADHD (attention deficit hyperactivity disorder)              Depression                                                   OCD (obsessive compulsive disorder)                          Vitamin D deficiency                                        Patient reports that she does that think that any food or pills are stuck in her throat at this time.    Reproductive/Obstetrics negative OB ROS                             Anesthesia Physical Anesthesia Plan  ASA: III  Anesthesia Plan: General   Post-op Pain Management:    Induction:   Airway Management Planned:   Additional Equipment:   Intra-op Plan:   Post-operative  Plan:   Informed Consent: I have reviewed the patients History and Physical, chart, labs and discussed the procedure including the risks, benefits and alternatives for the proposed anesthesia with the patient or authorized representative who has indicated his/her understanding and acceptance.   Dental Advisory Given  Plan Discussed with: Anesthesiologist, CRNA and Surgeon  Anesthesia Plan Comments:         Anesthesia Quick Evaluation

## 2014-11-27 NOTE — Op Note (Signed)
[facility] Patient Name: Leah Herrera Procedure Date: 11/27/2014 10:07 AM MRN: [id number] Account #: [id number] Date of Birth: [birth year] Admit Type: Outpatient Age: 39 Room: [facility] ENDO ROOM 4 Gender: Female Note Status: Finalized Procedure:         Upper GI endoscopy Indications:       Epigastric abdominal pain, Nausea with vomiting, hx of                     gastric bypass Providers:         Lupita Dawn. Candace Cruise, MD Referring MD:      Harlan Stains, MD (Referring MD) Medicines:         Monitored Anesthesia Care Complications:     No immediate complications. Procedure:         Pre-Anesthesia Assessment:                    - Prior to the procedure, a History and Physical was                     performed, and patient medications, allergies and                     sensitivities were reviewed. The patient's tolerance of                     previous anesthesia was reviewed.                    - The risks and benefits of the procedure and the sedation                     options and risks were discussed with the patient. All                     questions were answered and informed consent was obtained.                    - After reviewing the risks and benefits, the patient was                     deemed in satisfactory condition to undergo the procedure.                    After obtaining informed consent, the endoscope was passed                     under direct vision. Throughout the procedure, the                     patient's blood pressure, pulse, and oxygen saturations                     were monitored continuously. The Endoscope was introduced                     through the mouth, and advanced to the jejunum. The upper                     GI endoscopy was accomplished without difficulty. The                     patient tolerated the procedure well. Findings:      The examined esophagus was normal.      The  entire examined stomach was normal. Small  gastric pouch fom bypass       surgery      Tight anastomotic stricture. Barely passed scope. A TTS dilator was       passed through the scope. Dilation with a 12-13.5-15 mm anastomotic       balloon dilator was performed. Dilated to 73mm anastomotic balloon       dilator was performed. The dilation site was examined and showed       moderate improvement in luminal narrowing.      The examined jejunum was normal. Impression:        - Normal esophagus.                    - Normal stomach.                    - Normal examined jejunum.                    - No specimens collected.                    - Tight anastomotic stricture. Dilated to 35mm with TTS                     balloon. Recommendation:    - Discharge patient to home.                    - Observe patient's clinical course.                    - The findings and recommendations were discussed with the                     patient. Procedure Code(s): --- Professional ---                    [phone], Esophagogastroduodenoscopy, flexible, transoral;                     with dilation of gastric/duodenal stricture(s) (eg,                     balloon, bougie) Diagnosis Code(s): --- Professional ---                    R10.13, Epigastric pain                    R11.2, Nausea with vomiting, unspecified CPT copyright 2014 [employer] Medical Association. All rights reserved. The codes documented in this report are preliminary and upon coder review may  be revised to meet current compliance requirements. Hulen Luster, MD 11/27/2014 10:25:24 AM This report has been signed electronically. Number of Addenda: 0 Note Initiated On: 11/27/2014 10:07 AM      [facility]

## 2014-11-27 NOTE — Transfer of Care (Signed)
Immediate Anesthesia Transfer of Care Note  Patient: Leah Herrera  Procedure(s) Performed: Procedure(s): ESOPHAGOGASTRODUODENOSCOPY (EGD) WITH PROPOFOL (N/A)  Patient Location: PACU and Endoscopy Unit  Anesthesia Type:General  Level of Consciousness: awake  Airway & Oxygen Therapy: Patient Spontanous Breathing  Post-op Assessment: Report given to RN  Post vital signs: stable  Last Vitals:  Filed Vitals:   11/27/14 0909  BP: 114/75  Pulse: 62  Temp: 36.9 C  Resp: 16    Complications: No apparent anesthesia complications

## 2014-11-27 NOTE — H&P (Signed)
  Date of Initial H&P: 11/17/2014  History reviewed, patient examined, no change in status, stable for surgery.

## 2014-11-27 NOTE — Progress Notes (Signed)
Per patient request,Patient IV,( L wrist, 22 gauge) saline locked and patient was transferred to cancer center for iron infusion via wheelchair. She is a very difficult stick.

## 2014-11-29 ENCOUNTER — Encounter: Payer: Self-pay | Admitting: Gastroenterology

## 2014-12-01 ENCOUNTER — Ambulatory Visit: Payer: 59

## 2014-12-03 NOTE — Progress Notes (Signed)
[place]  Telephone:(336) [phone] Fax:(336) [phone]  ID: ZIVA NUNZIATA OB: 03-31-1975  MR#: [id number]  QIO#:[id number]  Patient Care Team: Harlan Stains, MD as PCP - General (Family Medicine)  CHIEF COMPLAINT:  Chief Complaint  Patient presents with  . Anemia    follow up    INTERVAL HISTORY: Patient last evaluated in clinic in 2015. She has noted some increased weakness and fatigue recently and one to the laboratory work and evaluation prior to changing insurances next month. She otherwise feels well.  She has no recent fevers or illnesses.  She has no neurologic complaints.  She has a good appetite and denies weight loss.  She denies any chest pain or shortness of breath.  She has no nausea, vomiting, constipation, or diarrhea.  She denies any melena or hematochezia.  She has no urinary complaints.  Patient offers no further specific complaints.  REVIEW OF SYSTEMS:   Review of Systems  Constitutional: Positive for malaise/fatigue.  Respiratory: Negative.   Cardiovascular: Negative.   Gastrointestinal: Negative.  Negative for constipation and blood in stool.  Musculoskeletal: Negative.   Neurological: Negative.     As per HPI. Otherwise, a complete review of systems is negatve.  PAST MEDICAL HISTORY: Past Medical History  Diagnosis Date  . Depression   . Anemia     chronic anemia, recieves iron infusions periodically  . Arthritis   . Cubital tunnel syndrome on right     PAST SURGICAL HISTORY: Past Surgical History  Procedure Laterality Date  . Cholecystectomy    . Gastric bypass  2008  . Orif tibia & fibula fractures    . Ulnar nerve transposition Right 08/28/2014    Procedure: RIGHT ULNAR NERVE DECOMPRESSION/ AND ANTERIOR TRANSPOSITION;  Surgeon: Roseanne Kaufman, MD;  Location: [place];  Service: Orthopedics;  Laterality: Right;    FAMILY HISTORY No family history on file.     ADVANCED DIRECTIVES:    HEALTH  MAINTENANCE: Social History  Substance Use Topics  . Smoking status: Current Every Day Smoker -- 0.50 packs/day  . Smokeless tobacco: Not on file  . Alcohol Use: Yes     Comment: social     Colonoscopy:  PAP:  Bone density:  Lipid panel:  Allergies  Allergen Reactions  . Penicillins Anaphylaxis    Current Outpatient Prescriptions  Medication Sig Dispense Refill  . albuterol (PROVENTIL HFA;VENTOLIN HFA) 108 (90 BASE) MCG/ACT inhaler Inhale into the lungs every 6 (six) hours as needed for wheezing or shortness of breath.    . celecoxib (CELEBREX) 200 MG capsule Take 200 mg by mouth 2 (two) times daily.    . Niacin (VITAMIN B-3 PO) Take by mouth.    . ondansetron (ZOFRAN-ODT) 8 MG disintegrating tablet Take by mouth.    . vitamin B-12 (CYANOCOBALAMIN) 1000 MCG tablet Take 1,000 mcg by mouth daily.    Marland Kitchen oxyCODONE-acetaminophen (ROXICET) 5-325 MG per tablet Take 2 tablets by mouth every 4 (four) hours as needed for severe pain. (Patient not taking: Reported on 11/24/2014) 45 tablet 0   No current facility-administered medications for this visit.    OBJECTIVE: Filed Vitals:   11/24/14 1352  BP: 123/86  Pulse: 76  Temp: 98.2 F (36.8 C)     Body mass index is 25.59 kg/(m^2).    ECOG FS:0 - Asymptomatic  General: Well-developed, well-nourished, no acute distress. Eyes: Pink conjunctiva, anicteric sclera. Lungs: Clear to auscultation bilaterally. Heart: Regular rate and rhythm. No rubs, murmurs, or gallops. Abdomen:  Soft, nontender, nondistended. No organomegaly noted, normoactive bowel sounds. Musculoskeletal: No edema, cyanosis, or clubbing. Neuro: Alert, answering all questions appropriately. Cranial nerves grossly intact. Skin: No rashes or petechiae noted. Psych: Normal affect.   LAB RESULTS:  Lab Results  Component Value Date   NA 140 01/16/2013   K 3.3* 01/16/2013   CL 106 01/16/2013   CO2 30 01/16/2013   GLUCOSE 86 01/16/2013   BUN 5* 01/16/2013   CREATININE  0.73 01/16/2013   CALCIUM 9.3 01/16/2013   PROT 7.2 01/16/2013   ALBUMIN 3.6 01/16/2013   AST 19 01/16/2013   ALT 20 01/16/2013   ALKPHOS 78 01/16/2013   BILITOT 0.4 01/16/2013   GFRNONAA >60 01/16/2013   GFRAA >60 01/16/2013    Lab Results  Component Value Date   WBC 8.7 11/13/2014   NEUTROABS 5.7 11/13/2014   HGB 12.7 11/13/2014   HCT 38.3 11/13/2014   MCV 88.5 11/13/2014   PLT 372 11/13/2014     STUDIES: Dg Ugi W/small Bowel  11/20/2014   CLINICAL DATA:  Prior gastric bypass. History of multiple prior dilatations.  EXAM: UPPER GI SERIES WITH SMALL BOWEL FOLLOW-THROUGH  FLUOROSCOPY TIME:  Radiation Exposure Index (as provided by the fluoroscopic device): 100.9 mGy  TECHNIQUE: Combined double contrast and single contrast upper GI series using effervescent crystals, thick barium, and thin barium. Subsequently, serial images of the small bowel were obtained including spot views of the terminal ileum.  COMPARISON:  None.  FINDINGS: Thoracic esophagus is unremarkable. Narrowing of the gastroenteric anastomosis is noted. A mild stricture cannot be excluded. Adjacent small collection of barium is noted. Ulceration at the anastomosis cannot be excluded. Proximal and distal small bowel are normal. No bowel obstruction. Terminal ileum is normal.  IMPRESSION: 1. Gastroenteric anastomosis mild narrowing is noted. Stricture cannot be excluded. 2. Small collection of barium is noted at the gastroenteric anastomosis. This could represent a small ulcer. 3. Small-bowel normal.   Electronically Signed   By: Marcello Moores  Register   On: 11/20/2014 12:54    ASSESSMENT: Iron deficiency anemia.  PLAN:    1.  Anemia: Likely secondary to malabsorption status post gastric bypass.  Although patient's hemoglobin continues to be within normal limits, but her iron stores are significantly decreased and she is mildly symptomatic. Proceed with 510 mg IV Feraheme today. Return to clinic in 1 week for a second infusion.  No follow-up has been scheduled, patient states she will call clinic in the future if her symptoms recur.   Patient expressed understanding and was in agreement with this plan. She also understands that She can call clinic at any time with any questions, concerns, or complaints.    Lloyd Huger, MD   12/03/2014 12:34 PM

## 2014-12-07 ENCOUNTER — Encounter: Payer: Self-pay | Admitting: Gastroenterology

## 2015-05-25 ENCOUNTER — Inpatient Hospital Stay: Payer: Commercial Managed Care - HMO | Attending: Oncology | Admitting: Oncology

## 2015-05-25 ENCOUNTER — Inpatient Hospital Stay: Payer: Commercial Managed Care - HMO

## 2015-05-25 ENCOUNTER — Other Ambulatory Visit: Payer: Self-pay

## 2015-05-25 VITALS — BP 143/87 | HR 90 | Temp 97.4°F | Resp 17 | Ht 64.5 in | Wt 141.4 lb

## 2015-05-25 DIAGNOSIS — R0602 Shortness of breath: Secondary | ICD-10-CM | POA: Insufficient documentation

## 2015-05-25 DIAGNOSIS — F329 Major depressive disorder, single episode, unspecified: Secondary | ICD-10-CM | POA: Insufficient documentation

## 2015-05-25 DIAGNOSIS — Z79899 Other long term (current) drug therapy: Secondary | ICD-10-CM | POA: Diagnosis not present

## 2015-05-25 DIAGNOSIS — E559 Vitamin D deficiency, unspecified: Secondary | ICD-10-CM | POA: Diagnosis not present

## 2015-05-25 DIAGNOSIS — R5383 Other fatigue: Secondary | ICD-10-CM | POA: Insufficient documentation

## 2015-05-25 DIAGNOSIS — D509 Iron deficiency anemia, unspecified: Secondary | ICD-10-CM | POA: Diagnosis not present

## 2015-05-25 DIAGNOSIS — M129 Arthropathy, unspecified: Secondary | ICD-10-CM | POA: Diagnosis not present

## 2015-05-25 DIAGNOSIS — F17210 Nicotine dependence, cigarettes, uncomplicated: Secondary | ICD-10-CM | POA: Diagnosis not present

## 2015-05-25 DIAGNOSIS — F429 Obsessive-compulsive disorder, unspecified: Secondary | ICD-10-CM | POA: Insufficient documentation

## 2015-05-25 DIAGNOSIS — Z9884 Bariatric surgery status: Secondary | ICD-10-CM | POA: Diagnosis not present

## 2015-05-25 DIAGNOSIS — F909 Attention-deficit hyperactivity disorder, unspecified type: Secondary | ICD-10-CM | POA: Insufficient documentation

## 2015-05-25 DIAGNOSIS — F419 Anxiety disorder, unspecified: Secondary | ICD-10-CM | POA: Diagnosis not present

## 2015-05-25 LAB — CBC WITH DIFFERENTIAL/PLATELET
Basophils Absolute: 0.1 10*3/uL (ref 0–0.1)
Basophils Relative: 1 %
Eosinophils Absolute: 0.2 10*3/uL (ref 0–0.7)
Eosinophils Relative: 2 %
HEMATOCRIT: 41.6 % (ref 35.0–47.0)
HEMOGLOBIN: 13.9 g/dL (ref 12.0–16.0)
LYMPHS PCT: 20 %
Lymphs Abs: 2.1 10*3/uL (ref 1.0–3.6)
MCH: 31.7 pg (ref 26.0–34.0)
MCHC: 33.4 g/dL (ref 32.0–36.0)
MCV: 95 fL (ref 80.0–100.0)
MONO ABS: 0.6 10*3/uL (ref 0.2–0.9)
MONOS PCT: 6 %
NEUTROS ABS: 7.2 10*3/uL — AB (ref 1.4–6.5)
NEUTROS PCT: 71 %
Platelets: 358 10*3/uL (ref 150–440)
RBC: 4.38 MIL/uL (ref 3.80–5.20)
RDW: 13.8 % (ref 11.5–14.5)
WBC: 10.2 10*3/uL (ref 3.6–11.0)

## 2015-05-25 LAB — IRON AND TIBC
Iron: 40 ug/dL (ref 28–170)
Saturation Ratios: 10 % — ABNORMAL LOW (ref 10.4–31.8)
TIBC: 388 ug/dL (ref 250–450)
UIBC: 348 ug/dL

## 2015-05-25 LAB — FERRITIN: Ferritin: 9 ng/mL — ABNORMAL LOW (ref 11–307)

## 2015-05-25 NOTE — Progress Notes (Signed)
[place]  Telephone:(336) [phone] Fax:(336) [phone]  ID: [name] OB: January 01, 1976  MR#: [id number]  [id number]  Patient Care Team: Harlan Stains, MD as PCP - General (Family Medicine) Harlan Stains, MD (Family Medicine)  CHIEF COMPLAINT:  Chief Complaint  Patient presents with  . Follow-up    Anemia    INTERVAL HISTORY: Patient returns to clinic today with complaints of shortness of breath and fatigue. She has no recent fevers or illnesses.  She has no neurologic complaints.  She has a good appetite and denies weight loss.  She denies any chest pain or shortness of breath.  She has no nausea, vomiting, constipation, or diarrhea.  She denies any melena or hematochezia.  She has no urinary complaints.  Patient offers no further specific complaints.  REVIEW OF SYSTEMS:   Review of Systems  Constitutional: Positive for malaise/fatigue.  Respiratory: Positive for shortness of breath.   Cardiovascular: Negative.   Gastrointestinal: Negative.  Negative for constipation and blood in stool.  Musculoskeletal: Negative.   Neurological: Negative.     As per HPI. Otherwise, a complete review of systems is negatve.  PAST MEDICAL HISTORY: Past Medical History  Diagnosis Date  . Anemia     chronic anemia, recieves iron infusions periodically  . Arthritis   . Cubital tunnel syndrome on right   . 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 Anemia   . Anxiety   . ADHD (attention deficit hyperactivity disorder)   . Depression   . OCD (obsessive compulsive disorder)   . Vitamin D deficiency     PAST SURGICAL HISTORY: Past Surgical History  Procedure Laterality Date  . Gastric bypass  2008  . Orif tibia & fibula fractures    . Ulnar nerve transposition Right 08/28/2014    Procedure: RIGHT ULNAR NERVE DECOMPRESSION/ AND ANTERIOR TRANSPOSITION;  Surgeon: Roseanne Kaufman, MD;  Location: [place];  Service:  Orthopedics;  Laterality: Right;  . Cholecystectomy    . Right leg repaired    . Gastric bipass    . Back surgery    . Transposition and decompression Right elbow  . Esophagogastroduodenoscopy (egd) with propofol N/A 11/27/2014    Procedure: ESOPHAGOGASTRODUODENOSCOPY (EGD) WITH PROPOFOL;  Surgeon: Hulen Luster, MD;  Location: [facility] ENDOSCOPY;  Service: Gastroenterology;  Laterality: N/A;    FAMILY HISTORY No family history on file.     ADVANCED DIRECTIVES:    HEALTH MAINTENANCE: Social History  Substance Use Topics  . Smoking status: Current Every Day Smoker -- 0.50 packs/day  . Smokeless tobacco: Never Used  . Alcohol Use: No     Comment: social     Allergies  Allergen Reactions  . Penicillins Anaphylaxis  . Penicillins     Current Outpatient Prescriptions  Medication Sig Dispense Refill  . cyanocobalamin 1000 MCG tablet Take 100 mcg by mouth daily.    . cyclobenzaprine (FLEXERIL) 10 MG tablet Take 10 mg by mouth 2 (two) times daily. Reported on 05/25/2015     No current facility-administered medications for this visit.    OBJECTIVE: Filed Vitals:   05/25/15 1505  BP: 143/87  Pulse: 90  Temp: 97.4 F (36.3 C)  Resp: 17     Body mass index is 23.91 kg/(m^2).    ECOG FS:0 - Asymptomatic  General: Well-developed, well-nourished, no acute distress. Eyes: Pink conjunctiva, anicteric sclera. Lungs: Clear to auscultation bilaterally. Heart: Regular rate and rhythm. No rubs, murmurs, or gallops. Abdomen: Soft, nontender, nondistended.  No organomegaly noted, normoactive bowel sounds. Musculoskeletal: No edema, cyanosis, or clubbing. Neuro: Alert, answering all questions appropriately. Cranial nerves grossly intact. Skin: No rashes or petechiae noted. Psych: Normal affect.   LAB RESULTS:  Lab Results  Component Value Date   NA 140 01/16/2013   K 3.3* 01/16/2013   CL 106 01/16/2013   CO2 30 01/16/2013   GLUCOSE 86 01/16/2013   BUN 5* 01/16/2013   CREATININE  0.73 01/16/2013   CALCIUM 9.3 01/16/2013   PROT 7.2 01/16/2013   ALBUMIN 3.6 01/16/2013   AST 19 01/16/2013   ALT 20 01/16/2013   ALKPHOS 78 01/16/2013   BILITOT 0.4 01/16/2013   GFRNONAA >60 01/16/2013   GFRAA >60 01/16/2013    Lab Results  Component Value Date   WBC 8.7 11/13/2014   NEUTROABS 5.7 11/13/2014   HGB 12.7 11/13/2014   HCT 38.3 11/13/2014   MCV 88.5 11/13/2014   PLT 372 11/13/2014   Lab Results  Component Value Date   IRON 40 05/25/2015   TIBC 388 05/25/2015   IRONPCTSAT 10* 05/25/2015    Lab Results  Component Value Date   FERRITIN 9* 05/25/2015     STUDIES: No results found.  ASSESSMENT: Iron deficiency anemia.  PLAN:    1.  Anemia: Likely secondary to malabsorption status post gastric bypass. Although patient's hemoglobin is within normal limits, her iron stores are significantly decreased and she is symptomatic. Return later this week for 510 mg IV Feraheme and then again in 2 weeks for a second infusion. She will return to clinic in 6 months with labs and possible feraheme.  Patient expressed understanding and was in agreement with this plan. She also understands that She can call clinic at any time with any questions, concerns, or complaints.    Mayra Reel, NP   05/25/2015 3:28 PM  Patient was seen and evaluated independently and I agree with the assessment and plan as dictated above.  Lloyd Huger, MD 05/28/2015 5:45 AM

## 2015-05-25 NOTE — Progress Notes (Signed)
SOB has increased since last visit and has worsened over the last 3 weeks.  Has nausea vomiting hx gastric bypass and if she eats and is nervous will vomit.

## 2015-05-28 ENCOUNTER — Inpatient Hospital Stay: Payer: Commercial Managed Care - HMO

## 2015-05-28 VITALS — BP 123/79 | HR 73 | Temp 98.0°F | Resp 18

## 2015-05-28 DIAGNOSIS — D509 Iron deficiency anemia, unspecified: Secondary | ICD-10-CM | POA: Diagnosis not present

## 2015-05-28 MED ORDER — SODIUM CHLORIDE 0.9 % IV SOLN
Freq: Once | INTRAVENOUS | Status: AC
  Administered 2015-05-28: 15:00:00 via INTRAVENOUS
  Filled 2015-05-28: qty 1000

## 2015-05-28 MED ORDER — SODIUM CHLORIDE 0.9 % IV SOLN
510.0000 mg | Freq: Once | INTRAVENOUS | Status: AC
  Administered 2015-05-28: 510 mg via INTRAVENOUS
  Filled 2015-05-28: qty 17

## 2015-06-04 ENCOUNTER — Inpatient Hospital Stay: Payer: Commercial Managed Care - HMO

## 2015-06-04 VITALS — BP 108/73 | HR 77 | Temp 97.4°F | Resp 18

## 2015-06-04 DIAGNOSIS — D509 Iron deficiency anemia, unspecified: Secondary | ICD-10-CM

## 2015-06-04 MED ORDER — SODIUM CHLORIDE 0.9 % IV SOLN
510.0000 mg | Freq: Once | INTRAVENOUS | Status: AC
  Administered 2015-06-04: 510 mg via INTRAVENOUS
  Filled 2015-06-04: qty 17

## 2015-06-04 MED ORDER — SODIUM CHLORIDE 0.9 % IV SOLN
Freq: Once | INTRAVENOUS | Status: AC
  Administered 2015-06-04: 15:00:00 via INTRAVENOUS
  Filled 2015-06-04: qty 1000

## 2015-11-19 ENCOUNTER — Other Ambulatory Visit: Payer: Self-pay | Admitting: *Deleted

## 2015-11-19 DIAGNOSIS — D509 Iron deficiency anemia, unspecified: Secondary | ICD-10-CM

## 2015-11-22 NOTE — Progress Notes (Signed)
[name]  Telephone:(336) [phone] Fax:(336) [phone]  ID: Leah Herrera OB: May 27, 1975  MR#: [id number]  CSN#:[id number]  Patient Care Team: Harlan Stains, MD as PCP - General (Family Medicine) Harlan Stains, MD (Family Medicine)  CHIEF COMPLAINT:  Iron deficiency anemia  INTERVAL HISTORY: Patient returns to clinic today for repeat laboratory work and further evaluation. She continues to have weakness and fatigue which is chronic and unchanged. She otherwise feels well. She does not complain of shortness of breath today. She has no recent fevers or illnesses.  She has no neurologic complaints.  She has a good appetite and denies weight loss.  She denies any chest pain.  She has no nausea, vomiting, constipation, or diarrhea.  She denies any melena or hematochezia.  She has no urinary complaints.  Patient offers no further specific complaints today.  REVIEW OF SYSTEMS:   Review of Systems  Constitutional: Positive for malaise/fatigue. Negative for fever and weight loss.  Respiratory: Negative.  Negative for cough and shortness of breath.   Cardiovascular: Negative.  Negative for chest pain.  Gastrointestinal: Negative for abdominal pain, blood in stool and melena.  Genitourinary: Negative.   Musculoskeletal: Negative.   Neurological: Positive for weakness.  Psychiatric/Behavioral: Negative.  The patient is not nervous/anxious.     As per HPI. Otherwise, a complete review of systems is negative.  PAST MEDICAL HISTORY: Past Medical History:  Diagnosis Date  . ADHD (attention deficit hyperactivity disorder)   . Anemia    chronic anemia, recieves iron infusions periodically  . Anemia   . 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 Anxiety   . Arthritis   . Cubital tunnel syndrome on right   . Depression   . OCD (obsessive compulsive disorder)   . Vitamin D deficiency     PAST SURGICAL HISTORY: Past Surgical History:    Procedure Laterality Date  . BACK SURGERY    . CHOLECYSTECTOMY    . ESOPHAGOGASTRODUODENOSCOPY (EGD) WITH PROPOFOL N/A 11/27/2014   Procedure: ESOPHAGOGASTRODUODENOSCOPY (EGD) WITH PROPOFOL;  Surgeon: Hulen Luster, MD;  Location: [facility] ENDOSCOPY;  Service: Gastroenterology;  Laterality: N/A;  . gastric bipass    . GASTRIC BYPASS  2008  . ORIF TIBIA & FIBULA FRACTURES    . right leg repaired    . transposition and decompression Right elbow  . ULNAR NERVE TRANSPOSITION Right 08/28/2014   Procedure: RIGHT ULNAR NERVE DECOMPRESSION/ AND ANTERIOR TRANSPOSITION;  Surgeon: Roseanne Kaufman, MD;  Location: [place];  Service: Orthopedics;  Laterality: Right;    FAMILY HISTORY: Reviewed and unchanged. No reported history of malignancy or chronic disease.     ADVANCED DIRECTIVES:    HEALTH MAINTENANCE: Social History  Substance Use Topics  . Smoking status: Current Every Day Smoker    Packs/day: 0.50  . Smokeless tobacco: Never Used  . Alcohol use No     Comment: social     Allergies  Allergen Reactions  . Penicillins Anaphylaxis  . Penicillins     Current Outpatient Prescriptions  Medication Sig Dispense Refill  . cyanocobalamin 1000 MCG tablet Take 100 mcg by mouth daily.    . cyclobenzaprine (FLEXERIL) 10 MG tablet Take 10 mg by mouth 2 (two) times daily. Reported on 05/25/2015     No current facility-administered medications for this visit.     OBJECTIVE: Vitals:   11/24/15 1332  BP: 127/79  Pulse: 72  Resp: 18  Temp: 98.3 F (36.8 C)  Body mass index is 24.93 kg/m.    ECOG FS:0 - Asymptomatic  General: Well-developed, well-nourished, no acute distress. Eyes: Pink conjunctiva, anicteric sclera. Lungs: Clear to auscultation bilaterally. Heart: Regular rate and rhythm. No rubs, murmurs, or gallops. Abdomen: Soft, nontender, nondistended. No organomegaly noted, normoactive bowel sounds. Musculoskeletal: No edema, cyanosis, or clubbing. Neuro: Alert,  answering all questions appropriately. Cranial nerves grossly intact. Skin: No rashes or petechiae noted. Psych: Normal affect.   LAB RESULTS:  Lab Results  Component Value Date   NA 137 11/24/2015   K 4.6 11/24/2015   CL 106 11/24/2015   CO2 26 11/24/2015   GLUCOSE 105 (H) 11/24/2015   BUN 8 11/24/2015   CREATININE 0.88 11/24/2015   CALCIUM 9.4 11/24/2015   PROT 7.5 11/24/2015   ALBUMIN 4.6 11/24/2015   AST 15 11/24/2015   ALT 10 (L) 11/24/2015   ALKPHOS 53 11/24/2015   BILITOT 0.4 11/24/2015   GFRNONAA >60 11/24/2015   GFRAA >60 11/24/2015    Lab Results  Component Value Date   WBC 14.6 (H) 11/24/2015   NEUTROABS 10.7 (H) 11/24/2015   HGB 14.4 11/24/2015   HCT 42.8 11/24/2015   MCV 93.3 11/24/2015   PLT 354 11/24/2015   Lab Results  Component Value Date   IRON 40 05/25/2015   TIBC 388 05/25/2015   IRONPCTSAT 10 (L) 05/25/2015    Lab Results  Component Value Date   FERRITIN 9 (L) 05/25/2015     STUDIES: No results found.  ASSESSMENT: Iron deficiency anemia.  PLAN:    1.  Iron deficiency anemia: Likely secondary to malabsorption status post gastric bypass. Patient's hemoglobin continues to be within normal limits. Her iron stores are pending at time of dictation. Previously her iron stores are significantly reduced and patient received 2 infusions of 510 mg IV Feraheme in March 2017.  If her iron stores continue to be low she will return to clinic for 2 additional infusions. If her iron stores within normal limits, she will return to clinic in 6 months with labs and possible feraheme.  Patient expressed understanding and was in agreement with this plan. She also understands that She can call clinic at any time with any questions, concerns, or complaints.    Lloyd Huger, MD   11/24/2015 2:16 PM

## 2015-11-23 ENCOUNTER — Ambulatory Visit: Payer: Commercial Managed Care - HMO

## 2015-11-23 ENCOUNTER — Ambulatory Visit: Payer: Commercial Managed Care - HMO | Admitting: Oncology

## 2015-11-23 ENCOUNTER — Other Ambulatory Visit: Payer: Commercial Managed Care - HMO

## 2015-11-24 ENCOUNTER — Inpatient Hospital Stay: Payer: Commercial Managed Care - HMO

## 2015-11-24 ENCOUNTER — Other Ambulatory Visit: Payer: Self-pay

## 2015-11-24 ENCOUNTER — Inpatient Hospital Stay: Payer: Commercial Managed Care - HMO | Attending: Oncology | Admitting: Oncology

## 2015-11-24 VITALS — BP 127/79 | HR 72 | Temp 98.3°F | Resp 18 | Wt 147.5 lb

## 2015-11-24 DIAGNOSIS — Z9884 Bariatric surgery status: Secondary | ICD-10-CM | POA: Insufficient documentation

## 2015-11-24 DIAGNOSIS — F429 Obsessive-compulsive disorder, unspecified: Secondary | ICD-10-CM | POA: Diagnosis not present

## 2015-11-24 DIAGNOSIS — M129 Arthropathy, unspecified: Secondary | ICD-10-CM | POA: Insufficient documentation

## 2015-11-24 DIAGNOSIS — R531 Weakness: Secondary | ICD-10-CM | POA: Insufficient documentation

## 2015-11-24 DIAGNOSIS — Z79899 Other long term (current) drug therapy: Secondary | ICD-10-CM | POA: Insufficient documentation

## 2015-11-24 DIAGNOSIS — R5383 Other fatigue: Secondary | ICD-10-CM | POA: Diagnosis not present

## 2015-11-24 DIAGNOSIS — F909 Attention-deficit hyperactivity disorder, unspecified type: Secondary | ICD-10-CM | POA: Diagnosis not present

## 2015-11-24 DIAGNOSIS — F17210 Nicotine dependence, cigarettes, uncomplicated: Secondary | ICD-10-CM | POA: Diagnosis not present

## 2015-11-24 DIAGNOSIS — E559 Vitamin D deficiency, unspecified: Secondary | ICD-10-CM | POA: Insufficient documentation

## 2015-11-24 DIAGNOSIS — F419 Anxiety disorder, unspecified: Secondary | ICD-10-CM

## 2015-11-24 DIAGNOSIS — F329 Major depressive disorder, single episode, unspecified: Secondary | ICD-10-CM | POA: Insufficient documentation

## 2015-11-24 DIAGNOSIS — D509 Iron deficiency anemia, unspecified: Secondary | ICD-10-CM | POA: Insufficient documentation

## 2015-11-24 LAB — CBC WITH DIFFERENTIAL/PLATELET
BASOS ABS: 0.1 10*3/uL (ref 0–0.1)
Basophils Relative: 1 %
EOS PCT: 3 %
Eosinophils Absolute: 0.5 10*3/uL (ref 0–0.7)
HCT: 42.8 % (ref 35.0–47.0)
Hemoglobin: 14.4 g/dL (ref 12.0–16.0)
LYMPHS PCT: 18 %
Lymphs Abs: 2.6 10*3/uL (ref 1.0–3.6)
MCH: 31.5 pg (ref 26.0–34.0)
MCHC: 33.8 g/dL (ref 32.0–36.0)
MCV: 93.3 fL (ref 80.0–100.0)
MONO ABS: 0.7 10*3/uL (ref 0.2–0.9)
Monocytes Relative: 5 %
Neutro Abs: 10.7 10*3/uL — ABNORMAL HIGH (ref 1.4–6.5)
Neutrophils Relative %: 73 %
PLATELETS: 354 10*3/uL (ref 150–440)
RBC: 4.58 MIL/uL (ref 3.80–5.20)
RDW: 12.8 % (ref 11.5–14.5)
WBC: 14.6 10*3/uL — ABNORMAL HIGH (ref 3.6–11.0)

## 2015-11-24 LAB — COMPREHENSIVE METABOLIC PANEL
ALT: 10 U/L — ABNORMAL LOW (ref 14–54)
AST: 15 U/L (ref 15–41)
Albumin: 4.6 g/dL (ref 3.5–5.0)
Alkaline Phosphatase: 53 U/L (ref 38–126)
Anion gap: 5 (ref 5–15)
BUN: 8 mg/dL (ref 6–20)
CHLORIDE: 106 mmol/L (ref 101–111)
CO2: 26 mmol/L (ref 22–32)
Calcium: 9.4 mg/dL (ref 8.9–10.3)
Creatinine, Ser: 0.88 mg/dL (ref 0.44–1.00)
Glucose, Bld: 105 mg/dL — ABNORMAL HIGH (ref 65–99)
POTASSIUM: 4.6 mmol/L (ref 3.5–5.1)
Sodium: 137 mmol/L (ref 135–145)
Total Bilirubin: 0.4 mg/dL (ref 0.3–1.2)
Total Protein: 7.5 g/dL (ref 6.5–8.1)

## 2015-11-24 LAB — LACTATE DEHYDROGENASE: LDH: 100 U/L (ref 98–192)

## 2015-11-24 NOTE — Progress Notes (Signed)
States is feeling well. Offers no complaints. 

## 2015-12-17 ENCOUNTER — Other Ambulatory Visit: Payer: Self-pay | Admitting: Oncology

## 2015-12-17 ENCOUNTER — Inpatient Hospital Stay: Payer: Commercial Managed Care - HMO | Attending: Oncology

## 2015-12-17 DIAGNOSIS — D509 Iron deficiency anemia, unspecified: Secondary | ICD-10-CM

## 2015-12-17 LAB — IRON AND TIBC
Iron: 63 ug/dL (ref 28–170)
SATURATION RATIOS: 16 % (ref 10.4–31.8)
TIBC: 401 ug/dL (ref 250–450)
UIBC: 338 ug/dL

## 2015-12-17 LAB — FERRITIN: FERRITIN: 13 ng/mL (ref 11–307)

## 2016-05-21 NOTE — Progress Notes (Signed)
[place]  Telephone:(336) [phone] Fax:(336) [phone]  ID: SAREN CORKERN OB: 04-09-1975  MR#: [id number]  AOZ#:[id number]  Patient Care Team: Harlan Stains, MD as PCP - General (Family Medicine) Harlan Stains, MD (Family Medicine)  CHIEF COMPLAINT:  Iron deficiency anemia  INTERVAL HISTORY: Patient returns to clinic today as an add-on with increasing weakness and fatigue. She also has worsening insomnia likely from switching to third shift. She otherwise feels well. She does not complain of shortness of breath today. She has no recent fevers or illnesses.  She has no neurologic complaints. She has a good appetite and denies weight loss. She denies any chest pain. She has no nausea, vomiting, constipation, or diarrhea.  She denies any melena or hematochezia.  She has no urinary complaints.  Patient offers no further specific complaints today.  REVIEW OF SYSTEMS:   Review of Systems  Constitutional: Positive for malaise/fatigue. Negative for fever and weight loss.  Respiratory: Negative.  Negative for cough and shortness of breath.   Cardiovascular: Negative.  Negative for chest pain and leg swelling.  Gastrointestinal: Negative for abdominal pain, blood in stool and melena.  Genitourinary: Negative.   Musculoskeletal: Negative.   Neurological: Positive for weakness. Negative for sensory change.  Psychiatric/Behavioral: The patient has insomnia. The patient is not nervous/anxious.     As per HPI. Otherwise, a complete review of systems is negative.  PAST MEDICAL HISTORY: Past Medical History:  Diagnosis Date  . ADHD (attention deficit hyperactivity disorder)   . Anemia    chronic anemia, recieves iron infusions periodically  . Anemia   . 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 Anxiety   . Arthritis   . Cubital tunnel syndrome on right   . Depression   . OCD (obsessive compulsive disorder)   . Vitamin D deficiency      PAST SURGICAL HISTORY: Past Surgical History:  Procedure Laterality Date  . BACK SURGERY    . CHOLECYSTECTOMY    . ESOPHAGOGASTRODUODENOSCOPY (EGD) WITH PROPOFOL N/A 11/27/2014   Procedure: ESOPHAGOGASTRODUODENOSCOPY (EGD) WITH PROPOFOL;  Surgeon: Hulen Luster, MD;  Location: [facility] ENDOSCOPY;  Service: Gastroenterology;  Laterality: N/A;  . gastric bipass    . GASTRIC BYPASS  2008  . ORIF TIBIA & FIBULA FRACTURES    . right leg repaired    . transposition and decompression Right elbow  . ULNAR NERVE TRANSPOSITION Right 08/28/2014   Procedure: RIGHT ULNAR NERVE DECOMPRESSION/ AND ANTERIOR TRANSPOSITION;  Surgeon: Roseanne Kaufman, MD;  Location: [place];  Service: Orthopedics;  Laterality: Right;    FAMILY HISTORY: Reviewed and unchanged. No reported history of malignancy or chronic disease.     ADVANCED DIRECTIVES:    HEALTH MAINTENANCE: Social History  Substance Use Topics  . Smoking status: Current Every Day Smoker    Packs/day: 0.50  . Smokeless tobacco: Never Used  . Alcohol use No     Comment: social     Allergies  Allergen Reactions  . Penicillins Anaphylaxis  . Penicillins     Current Outpatient Prescriptions  Medication Sig Dispense Refill  . cyanocobalamin 1000 MCG tablet Take 100 mcg by mouth daily.     No current facility-administered medications for this visit.     OBJECTIVE: Vitals:   05/23/16 1446  BP: 123/84  Pulse: 72  Resp: 18  Temp: 98.4 F (36.9 C)     Body mass index is 22.98 kg/m.    ECOG FS:0 - Asymptomatic  General: Well-developed,  well-nourished, no acute distress. Eyes: Pink conjunctiva, anicteric sclera. Lungs: Clear to auscultation bilaterally. Heart: Regular rate and rhythm. No rubs, murmurs, or gallops. Abdomen: Soft, nontender, nondistended. No organomegaly noted, normoactive bowel sounds. Musculoskeletal: No edema, cyanosis, or clubbing. Neuro: Alert, answering all questions appropriately. Cranial nerves  grossly intact. Skin: No rashes or petechiae noted. Psych: Normal affect.   LAB RESULTS:  Lab Results  Component Value Date   NA 137 11/24/2015   K 4.6 11/24/2015   CL 106 11/24/2015   CO2 26 11/24/2015   GLUCOSE 105 (H) 11/24/2015   BUN 8 11/24/2015   CREATININE 0.88 11/24/2015   CALCIUM 9.4 11/24/2015   PROT 7.5 11/24/2015   ALBUMIN 4.6 11/24/2015   AST 15 11/24/2015   ALT 10 (L) 11/24/2015   ALKPHOS 53 11/24/2015   BILITOT 0.4 11/24/2015   GFRNONAA >60 11/24/2015   GFRAA >60 11/24/2015    Lab Results  Component Value Date   WBC 15.6 (H) 05/23/2016   NEUTROABS 10.0 (H) 05/23/2016   HGB 11.0 (L) 05/23/2016   HCT 32.6 (L) 05/23/2016   MCV 82.1 05/23/2016   PLT 483 (H) 05/23/2016   Lab Results  Component Value Date   IRON 26 (L) 05/23/2016   TIBC 443 05/23/2016   IRONPCTSAT 6 (L) 05/23/2016    Lab Results  Component Value Date   FERRITIN 5 (L) 05/23/2016     STUDIES: No results found.  ASSESSMENT: Iron deficiency anemia.  PLAN:    1.  Iron deficiency anemia: Likely secondary to malabsorption status post gastric bypass. Patient's hemoglobin and iron stores have trended down and she is symptomatic. We will proceed with 510 mg IV Feraheme today. Return to clinic in 1 week for a second infusion and then in 3 months for further evaluation.  2. Insomnia: Likely secondary to switching to third shift, monitor.  Patient expressed understanding and was in agreement with this plan. She also understands that She can call clinic at any time with any questions, concerns, or complaints.    Lloyd Huger, MD   05/28/2016 10:18 AM

## 2016-05-23 ENCOUNTER — Inpatient Hospital Stay (HOSPITAL_BASED_OUTPATIENT_CLINIC_OR_DEPARTMENT_OTHER): Payer: Commercial Managed Care - HMO | Admitting: Oncology

## 2016-05-23 ENCOUNTER — Inpatient Hospital Stay: Payer: Commercial Managed Care - HMO | Attending: Oncology

## 2016-05-23 ENCOUNTER — Inpatient Hospital Stay: Payer: Commercial Managed Care - HMO

## 2016-05-23 ENCOUNTER — Encounter: Payer: Self-pay | Admitting: Oncology

## 2016-05-23 VITALS — BP 123/84 | HR 72 | Temp 98.4°F | Resp 18 | Ht 64.5 in | Wt 136.0 lb

## 2016-05-23 DIAGNOSIS — F909 Attention-deficit hyperactivity disorder, unspecified type: Secondary | ICD-10-CM | POA: Diagnosis not present

## 2016-05-23 DIAGNOSIS — D649 Anemia, unspecified: Secondary | ICD-10-CM | POA: Insufficient documentation

## 2016-05-23 DIAGNOSIS — D508 Other iron deficiency anemias: Secondary | ICD-10-CM

## 2016-05-23 DIAGNOSIS — G4700 Insomnia, unspecified: Secondary | ICD-10-CM | POA: Diagnosis not present

## 2016-05-23 DIAGNOSIS — E559 Vitamin D deficiency, unspecified: Secondary | ICD-10-CM | POA: Insufficient documentation

## 2016-05-23 DIAGNOSIS — R5383 Other fatigue: Secondary | ICD-10-CM | POA: Insufficient documentation

## 2016-05-23 DIAGNOSIS — R531 Weakness: Secondary | ICD-10-CM | POA: Insufficient documentation

## 2016-05-23 DIAGNOSIS — M129 Arthropathy, unspecified: Secondary | ICD-10-CM

## 2016-05-23 DIAGNOSIS — Z79899 Other long term (current) drug therapy: Secondary | ICD-10-CM | POA: Diagnosis not present

## 2016-05-23 DIAGNOSIS — F329 Major depressive disorder, single episode, unspecified: Secondary | ICD-10-CM

## 2016-05-23 DIAGNOSIS — D509 Iron deficiency anemia, unspecified: Secondary | ICD-10-CM

## 2016-05-23 DIAGNOSIS — F429 Obsessive-compulsive disorder, unspecified: Secondary | ICD-10-CM | POA: Insufficient documentation

## 2016-05-23 DIAGNOSIS — F419 Anxiety disorder, unspecified: Secondary | ICD-10-CM

## 2016-05-23 DIAGNOSIS — F17210 Nicotine dependence, cigarettes, uncomplicated: Secondary | ICD-10-CM | POA: Diagnosis not present

## 2016-05-23 LAB — CBC WITH DIFFERENTIAL/PLATELET
BASOS ABS: 0.2 10*3/uL — AB (ref 0–0.1)
Basophils Relative: 1 %
EOS PCT: 3 %
Eosinophils Absolute: 0.4 10*3/uL (ref 0–0.7)
HEMATOCRIT: 32.6 % — AB (ref 35.0–47.0)
Hemoglobin: 11 g/dL — ABNORMAL LOW (ref 12.0–16.0)
LYMPHS ABS: 3.9 10*3/uL — AB (ref 1.0–3.6)
Lymphocytes Relative: 25 %
MCH: 27.7 pg (ref 26.0–34.0)
MCHC: 33.7 g/dL (ref 32.0–36.0)
MCV: 82.1 fL (ref 80.0–100.0)
MONO ABS: 1.1 10*3/uL — AB (ref 0.2–0.9)
Monocytes Relative: 7 %
Neutro Abs: 10 10*3/uL — ABNORMAL HIGH (ref 1.4–6.5)
Neutrophils Relative %: 64 %
Platelets: 483 10*3/uL — ABNORMAL HIGH (ref 150–440)
RBC: 3.97 MIL/uL (ref 3.80–5.20)
RDW: 15 % — AB (ref 11.5–14.5)
WBC: 15.6 10*3/uL — ABNORMAL HIGH (ref 3.6–11.0)

## 2016-05-23 LAB — IRON AND TIBC
IRON: 26 ug/dL — AB (ref 28–170)
Saturation Ratios: 6 % — ABNORMAL LOW (ref 10.4–31.8)
TIBC: 443 ug/dL (ref 250–450)
UIBC: 417 ug/dL

## 2016-05-23 LAB — FERRITIN: Ferritin: 5 ng/mL — ABNORMAL LOW (ref 11–307)

## 2016-05-23 MED ORDER — SODIUM CHLORIDE 0.9 % IV SOLN
510.0000 mg | Freq: Once | INTRAVENOUS | Status: AC
  Administered 2016-05-23: 510 mg via INTRAVENOUS
  Filled 2016-05-23: qty 17

## 2016-05-23 MED ORDER — SODIUM CHLORIDE 0.9 % IV SOLN
Freq: Once | INTRAVENOUS | Status: AC
  Administered 2016-05-23: 16:00:00 via INTRAVENOUS
  Filled 2016-05-23: qty 1000

## 2016-05-23 NOTE — Progress Notes (Signed)
Pt still with lots of fatigue, she has switched to nightshift and she can't sleep-insomnia has been a problem way before her switching to night shift

## 2016-05-30 ENCOUNTER — Inpatient Hospital Stay: Payer: Commercial Managed Care - HMO

## 2016-06-01 ENCOUNTER — Inpatient Hospital Stay: Payer: Commercial Managed Care - HMO

## 2016-06-01 VITALS — BP 138/86 | HR 62 | Temp 97.9°F | Resp 18

## 2016-06-01 DIAGNOSIS — D508 Other iron deficiency anemias: Secondary | ICD-10-CM

## 2016-06-01 DIAGNOSIS — D509 Iron deficiency anemia, unspecified: Secondary | ICD-10-CM | POA: Diagnosis not present

## 2016-06-01 MED ORDER — SODIUM CHLORIDE 0.9 % IV SOLN
510.0000 mg | Freq: Once | INTRAVENOUS | Status: AC
  Administered 2016-06-01: 510 mg via INTRAVENOUS
  Filled 2016-06-01: qty 17

## 2016-06-01 MED ORDER — SODIUM CHLORIDE 0.9 % IV SOLN
Freq: Once | INTRAVENOUS | Status: AC
  Administered 2016-06-01: 11:00:00 via INTRAVENOUS
  Filled 2016-06-01: qty 1000

## 2016-07-24 DIAGNOSIS — M898X6 Other specified disorders of bone, lower leg: Secondary | ICD-10-CM | POA: Diagnosis not present

## 2016-08-23 NOTE — Progress Notes (Deleted)
[place]  Telephone:(336) [phone] Fax:(336) [phone]  ID: Leah Herrera OB: 02-06-1976  MR#: [id number]  PXT#:[id number]  Patient Care Team: Harlan Stains, MD as PCP - General (Family Medicine) Harlan Stains, MD (Family Medicine)  CHIEF COMPLAINT:  Iron deficiency anemia  INTERVAL HISTORY: Patient returns to clinic today as an add-on with increasing weakness and fatigue. She also has worsening insomnia likely from switching to third shift. She otherwise feels well. She does not complain of shortness of breath today. She has no recent fevers or illnesses.  She has no neurologic complaints. She has a good appetite and denies weight loss. She denies any chest pain. She has no nausea, vomiting, constipation, or diarrhea.  She denies any melena or hematochezia.  She has no urinary complaints.  Patient offers no further specific complaints today.  REVIEW OF SYSTEMS:   Review of Systems  Constitutional: Positive for malaise/fatigue. Negative for fever and weight loss.  Respiratory: Negative.  Negative for cough and shortness of breath.   Cardiovascular: Negative.  Negative for chest pain and leg swelling.  Gastrointestinal: Negative for abdominal pain, blood in stool and melena.  Genitourinary: Negative.   Musculoskeletal: Negative.   Neurological: Positive for weakness. Negative for sensory change.  Psychiatric/Behavioral: The patient has insomnia. The patient is not nervous/anxious.     As per HPI. Otherwise, a complete review of systems is negative.  PAST MEDICAL HISTORY: Past Medical History:  Diagnosis Date  . ADHD (attention deficit hyperactivity disorder)   . Anemia    chronic anemia, recieves iron infusions periodically  . Anemia   . 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 Anxiety   . Arthritis   . Cubital tunnel syndrome on right   . Depression   . OCD (obsessive compulsive disorder)   . Vitamin D deficiency      PAST SURGICAL HISTORY: Past Surgical History:  Procedure Laterality Date  . BACK SURGERY    . CHOLECYSTECTOMY    . ESOPHAGOGASTRODUODENOSCOPY (EGD) WITH PROPOFOL N/A 11/27/2014   Procedure: ESOPHAGOGASTRODUODENOSCOPY (EGD) WITH PROPOFOL;  Surgeon: Hulen Luster, MD;  Location: [facility] ENDOSCOPY;  Service: Gastroenterology;  Laterality: N/A;  . gastric bipass    . GASTRIC BYPASS  2008  . ORIF TIBIA & FIBULA FRACTURES    . right leg repaired    . transposition and decompression Right elbow  . ULNAR NERVE TRANSPOSITION Right 08/28/2014   Procedure: RIGHT ULNAR NERVE DECOMPRESSION/ AND ANTERIOR TRANSPOSITION;  Surgeon: Roseanne Kaufman, MD;  Location: [place];  Service: Orthopedics;  Laterality: Right;    FAMILY HISTORY: Reviewed and unchanged. No reported history of malignancy or chronic disease.     ADVANCED DIRECTIVES:    HEALTH MAINTENANCE: Social History  Substance Use Topics  . Smoking status: Current Every Day Smoker    Packs/day: 0.50  . Smokeless tobacco: Never Used  . Alcohol use No     Comment: social     Allergies  Allergen Reactions  . Penicillins Anaphylaxis  . Penicillins     Current Outpatient Prescriptions  Medication Sig Dispense Refill  . cyanocobalamin 1000 MCG tablet Take 100 mcg by mouth daily.     No current facility-administered medications for this visit.     OBJECTIVE: There were no vitals filed for this visit.   There is no height or weight on file to calculate BMI.    ECOG FS:0 - Asymptomatic  General: Well-developed, well-nourished, no acute distress. Eyes: Pink conjunctiva, anicteric sclera.  Lungs: Clear to auscultation bilaterally. Heart: Regular rate and rhythm. No rubs, murmurs, or gallops. Abdomen: Soft, nontender, nondistended. No organomegaly noted, normoactive bowel sounds. Musculoskeletal: No edema, cyanosis, or clubbing. Neuro: Alert, answering all questions appropriately. Cranial nerves grossly intact. Skin: No  rashes or petechiae noted. Psych: Normal affect.   LAB RESULTS:  Lab Results  Component Value Date   NA 137 11/24/2015   K 4.6 11/24/2015   CL 106 11/24/2015   CO2 26 11/24/2015   GLUCOSE 105 (H) 11/24/2015   BUN 8 11/24/2015   CREATININE 0.88 11/24/2015   CALCIUM 9.4 11/24/2015   PROT 7.5 11/24/2015   ALBUMIN 4.6 11/24/2015   AST 15 11/24/2015   ALT 10 (L) 11/24/2015   ALKPHOS 53 11/24/2015   BILITOT 0.4 11/24/2015   GFRNONAA >60 11/24/2015   GFRAA >60 11/24/2015    Lab Results  Component Value Date   WBC 15.6 (H) 05/23/2016   NEUTROABS 10.0 (H) 05/23/2016   HGB 11.0 (L) 05/23/2016   HCT 32.6 (L) 05/23/2016   MCV 82.1 05/23/2016   PLT 483 (H) 05/23/2016   Lab Results  Component Value Date   IRON 26 (L) 05/23/2016   TIBC 443 05/23/2016   IRONPCTSAT 6 (L) 05/23/2016    Lab Results  Component Value Date   FERRITIN 5 (L) 05/23/2016     STUDIES: No results found.  ASSESSMENT: Iron deficiency anemia.  PLAN:    1.  Iron deficiency anemia: Likely secondary to malabsorption status post gastric bypass. Patient's hemoglobin and iron stores have trended down and she is symptomatic. We will proceed with 510 mg IV Feraheme today. Return to clinic in 1 week for a second infusion and then in 3 months for further evaluation.  2. Insomnia: Likely secondary to switching to third shift, monitor.  Patient expressed understanding and was in agreement with this plan. She also understands that She can call clinic at any time with any questions, concerns, or complaints.    Lloyd Huger, MD   08/23/2016 2:50 PM

## 2016-08-24 ENCOUNTER — Inpatient Hospital Stay: Payer: 59 | Admitting: Oncology

## 2016-08-24 ENCOUNTER — Inpatient Hospital Stay: Payer: 59

## 2016-09-24 NOTE — Progress Notes (Signed)
[place]  Telephone:(336) [phone] Fax:(336) [phone]  ID: Leah Herrera OB: [birth year]  MR#: [id number]  VZC#:[id number]  Patient Care Team: Harlan Stains, MD as PCP - General (Family Medicine) Harlan Stains, MD (Family Medicine)  CHIEF COMPLAINT:  Iron deficiency anemia  INTERVAL HISTORY: Patient returns to clinic today for iron deficient anemia. She continues to complain of fatigue, shortness of breath, leg cramps and intermittent nausea with vomiting. She also has worsening insomnia likely from switching to third shift. She otherwise feels well. She has no recent fevers or illnesses.  She has no neurologic complaints. She has a good appetite and denies weight loss. She denies any chest pain. She has no constipation, or diarrhea.  She denies any melena or hematochezia.  She has no urinary complaints.  Patient offers no further specific complaints today.  REVIEW OF SYSTEMS:   Review of Systems  Constitutional: Positive for malaise/fatigue. Negative for fever and weight loss.  Respiratory: Negative.  Negative for cough and shortness of breath.   Cardiovascular: Negative.  Negative for chest pain and leg swelling.  Gastrointestinal: Negative for abdominal pain, blood in stool and melena.  Genitourinary: Negative.   Musculoskeletal: Negative.   Neurological: Positive for weakness. Negative for sensory change.  Psychiatric/Behavioral: The patient has insomnia. The patient is not nervous/anxious.     As per HPI. Otherwise, a complete review of systems is negative.  PAST MEDICAL HISTORY: Past Medical History:  Diagnosis Date  . ADHD (attention deficit hyperactivity disorder)   . Anemia    chronic anemia, recieves iron infusions periodically  . Anemia   . 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 Anxiety   . Arthritis   . Cubital tunnel syndrome on right   . Depression   . OCD (obsessive compulsive disorder)   . Vitamin D  deficiency     PAST SURGICAL HISTORY: Past Surgical History:  Procedure Laterality Date  . BACK SURGERY    . CHOLECYSTECTOMY    . ESOPHAGOGASTRODUODENOSCOPY (EGD) WITH PROPOFOL N/A 11/27/2014   Procedure: ESOPHAGOGASTRODUODENOSCOPY (EGD) WITH PROPOFOL;  Surgeon: Hulen Luster, MD;  Location: [facility] ENDOSCOPY;  Service: Gastroenterology;  Laterality: N/A;  . gastric bipass    . GASTRIC BYPASS  2008  . ORIF TIBIA & FIBULA FRACTURES    . right leg repaired    . transposition and decompression Right elbow  . ULNAR NERVE TRANSPOSITION Right 08/28/2014   Procedure: RIGHT ULNAR NERVE DECOMPRESSION/ AND ANTERIOR TRANSPOSITION;  Surgeon: Roseanne Kaufman, MD;  Location: [place];  Service: Orthopedics;  Laterality: Right;    FAMILY HISTORY: Reviewed and unchanged. No reported history of malignancy or chronic disease.     ADVANCED DIRECTIVES:    HEALTH MAINTENANCE: Social History  Substance Use Topics  . Smoking status: Current Every Day Smoker    Packs/day: 0.50  . Smokeless tobacco: Never Used  . Alcohol use No     Comment: social     Allergies  Allergen Reactions  . Penicillins Anaphylaxis  . Penicillins     Current Outpatient Prescriptions  Medication Sig Dispense Refill  . cyanocobalamin 1000 MCG tablet Take 100 mcg by mouth daily.     No current facility-administered medications for this visit.     OBJECTIVE: Vitals:   09/25/16 1440  BP: 117/82  Pulse: 60  Resp: 18  Temp: 97.9 F (36.6 C)     Body mass index is 23 kg/m.    ECOG FS:0 - Asymptomatic  General:  Well-developed, well-nourished, no acute distress. Eyes: Pink conjunctiva, anicteric sclera. Lungs: Clear to auscultation bilaterally. Heart: Regular rate and rhythm. No rubs, murmurs, or gallops. Abdomen: Soft, nontender, nondistended. No organomegaly noted, normoactive bowel sounds. Musculoskeletal: No edema, cyanosis, or clubbing. Neuro: Alert, answering all questions appropriately. Cranial  nerves grossly intact. Skin: No rashes or petechiae noted. Psych: Normal affect.   LAB RESULTS:  Lab Results  Component Value Date   NA 137 11/24/2015   K 4.6 11/24/2015   CL 106 11/24/2015   CO2 26 11/24/2015   GLUCOSE 105 (H) 11/24/2015   BUN 8 11/24/2015   CREATININE 0.88 11/24/2015   CALCIUM 9.4 11/24/2015   PROT 7.5 11/24/2015   ALBUMIN 4.6 11/24/2015   AST 15 11/24/2015   ALT 10 (L) 11/24/2015   ALKPHOS 53 11/24/2015   BILITOT 0.4 11/24/2015   GFRNONAA >60 11/24/2015   GFRAA >60 11/24/2015    Lab Results  Component Value Date   WBC 8.9 09/25/2016   NEUTROABS 5.7 09/25/2016   HGB 13.6 09/25/2016   HCT 39.6 09/25/2016   MCV 91.2 09/25/2016   PLT 362 09/25/2016   Lab Results  Component Value Date   IRON 40 09/25/2016   TIBC 401 09/25/2016   IRONPCTSAT 10 (L) 09/25/2016    Lab Results  Component Value Date   FERRITIN 8 (L) 09/25/2016     STUDIES: No results found.  ASSESSMENT: Iron deficiency anemia.  PLAN:    1.  Iron deficiency anemia: Likely secondary to malabsorption status post gastric bypass. Ferritin 8 and iron sat is 10 today. We will proceed with 510 mg IV Feraheme today. Return to clinic in 1 week for a second infusion and then in 3 months for further evaluation.  2. Insomnia: Likely secondary to switching to third shift, monitor.  Patient expressed understanding and was in agreement with this plan. She also understands that She can call clinic at any time with any questions, concerns, or complaints.    Jacquelin Hawking, NP   09/25/2016 4:59 PM

## 2016-09-25 ENCOUNTER — Inpatient Hospital Stay (HOSPITAL_BASED_OUTPATIENT_CLINIC_OR_DEPARTMENT_OTHER): Payer: 59 | Admitting: Oncology

## 2016-09-25 ENCOUNTER — Inpatient Hospital Stay: Payer: 59

## 2016-09-25 ENCOUNTER — Inpatient Hospital Stay: Payer: 59 | Attending: Oncology

## 2016-09-25 VITALS — BP 117/82 | HR 60 | Temp 97.9°F | Resp 18 | Wt 136.1 lb

## 2016-09-25 DIAGNOSIS — R5383 Other fatigue: Secondary | ICD-10-CM

## 2016-09-25 DIAGNOSIS — R0602 Shortness of breath: Secondary | ICD-10-CM

## 2016-09-25 DIAGNOSIS — R5381 Other malaise: Secondary | ICD-10-CM | POA: Diagnosis not present

## 2016-09-25 DIAGNOSIS — D509 Iron deficiency anemia, unspecified: Secondary | ICD-10-CM | POA: Insufficient documentation

## 2016-09-25 DIAGNOSIS — Z880 Allergy status to penicillin: Secondary | ICD-10-CM | POA: Diagnosis not present

## 2016-09-25 DIAGNOSIS — F909 Attention-deficit hyperactivity disorder, unspecified type: Secondary | ICD-10-CM | POA: Insufficient documentation

## 2016-09-25 DIAGNOSIS — G4700 Insomnia, unspecified: Secondary | ICD-10-CM | POA: Insufficient documentation

## 2016-09-25 DIAGNOSIS — R112 Nausea with vomiting, unspecified: Secondary | ICD-10-CM | POA: Insufficient documentation

## 2016-09-25 DIAGNOSIS — F17210 Nicotine dependence, cigarettes, uncomplicated: Secondary | ICD-10-CM | POA: Insufficient documentation

## 2016-09-25 DIAGNOSIS — Z79899 Other long term (current) drug therapy: Secondary | ICD-10-CM | POA: Insufficient documentation

## 2016-09-25 DIAGNOSIS — F429 Obsessive-compulsive disorder, unspecified: Secondary | ICD-10-CM | POA: Insufficient documentation

## 2016-09-25 DIAGNOSIS — D508 Other iron deficiency anemias: Secondary | ICD-10-CM

## 2016-09-25 DIAGNOSIS — F418 Other specified anxiety disorders: Secondary | ICD-10-CM | POA: Diagnosis not present

## 2016-09-25 DIAGNOSIS — M1990 Unspecified osteoarthritis, unspecified site: Secondary | ICD-10-CM

## 2016-09-25 DIAGNOSIS — R252 Cramp and spasm: Secondary | ICD-10-CM | POA: Insufficient documentation

## 2016-09-25 DIAGNOSIS — E559 Vitamin D deficiency, unspecified: Secondary | ICD-10-CM | POA: Insufficient documentation

## 2016-09-25 DIAGNOSIS — Z9884 Bariatric surgery status: Secondary | ICD-10-CM | POA: Diagnosis not present

## 2016-09-25 LAB — IRON AND TIBC
IRON: 40 ug/dL (ref 28–170)
SATURATION RATIOS: 10 % — AB (ref 10.4–31.8)
TIBC: 401 ug/dL (ref 250–450)
UIBC: 361 ug/dL

## 2016-09-25 LAB — CBC WITH DIFFERENTIAL/PLATELET
BASOS ABS: 0.1 10*3/uL (ref 0–0.1)
BASOS PCT: 1 %
EOS ABS: 0.2 10*3/uL (ref 0–0.7)
EOS PCT: 2 %
HCT: 39.6 % (ref 35.0–47.0)
HEMOGLOBIN: 13.6 g/dL (ref 12.0–16.0)
LYMPHS ABS: 2.2 10*3/uL (ref 1.0–3.6)
Lymphocytes Relative: 25 %
MCH: 31.3 pg (ref 26.0–34.0)
MCHC: 34.3 g/dL (ref 32.0–36.0)
MCV: 91.2 fL (ref 80.0–100.0)
Monocytes Absolute: 0.7 10*3/uL (ref 0.2–0.9)
Monocytes Relative: 8 %
NEUTROS PCT: 64 %
Neutro Abs: 5.7 10*3/uL (ref 1.4–6.5)
PLATELETS: 362 10*3/uL (ref 150–440)
RBC: 4.35 MIL/uL (ref 3.80–5.20)
RDW: 13.4 % (ref 11.5–14.5)
WBC: 8.9 10*3/uL (ref 3.6–11.0)

## 2016-09-25 LAB — FERRITIN: FERRITIN: 8 ng/mL — AB (ref 11–307)

## 2016-09-25 MED ORDER — SODIUM CHLORIDE 0.9 % IV SOLN
510.0000 mg | Freq: Once | INTRAVENOUS | Status: AC
  Administered 2016-09-25: 510 mg via INTRAVENOUS
  Filled 2016-09-25: qty 17

## 2016-09-25 NOTE — Progress Notes (Signed)
Patient is here for follow up, she is doing well does mention nausea and vomiting often.

## 2016-10-02 ENCOUNTER — Inpatient Hospital Stay: Payer: 59

## 2016-10-05 ENCOUNTER — Inpatient Hospital Stay: Payer: 59 | Attending: Oncology

## 2016-10-05 VITALS — BP 124/79 | HR 65 | Temp 96.5°F | Resp 18

## 2016-10-05 DIAGNOSIS — D509 Iron deficiency anemia, unspecified: Secondary | ICD-10-CM | POA: Diagnosis not present

## 2016-10-05 DIAGNOSIS — Z79899 Other long term (current) drug therapy: Secondary | ICD-10-CM | POA: Diagnosis not present

## 2016-10-05 DIAGNOSIS — D508 Other iron deficiency anemias: Secondary | ICD-10-CM

## 2016-10-05 MED ORDER — SODIUM CHLORIDE 0.9 % IV SOLN
510.0000 mg | Freq: Once | INTRAVENOUS | Status: AC
  Administered 2016-10-05: 510 mg via INTRAVENOUS
  Filled 2016-10-05: qty 17

## 2016-10-05 MED ORDER — SODIUM CHLORIDE 0.9 % IV SOLN
Freq: Once | INTRAVENOUS | Status: AC
  Administered 2016-10-05: 14:00:00 via INTRAVENOUS
  Filled 2016-10-05: qty 1000

## 2016-10-16 IMAGING — RF DG UGI W/ SMALL BOWEL
15 of 24 series · 15 of 24 positions shown · non-contrast
Comparison: None.

CLINICAL DATA: Prior gastric bypass. History of multiple prior
dilatations.

EXAM:
UPPER GI SERIES WITH SMALL BOWEL FOLLOW-THROUGH
FLUOROSCOPY TIME:  Radiation Exposure Index (as provided by the
fluoroscopic device): 100.9 mGy
TECHNIQUE: Combined double contrast and single contrast upper GI series using
effervescent crystals, thick barium, and thin barium. Subsequently,
serial images of the small bowel were obtained including spot views
of the terminal ileum.

[Series 1: t abdomen supine · 0.14mm/px · 1 of 1 slices shown]
[im 1/1]
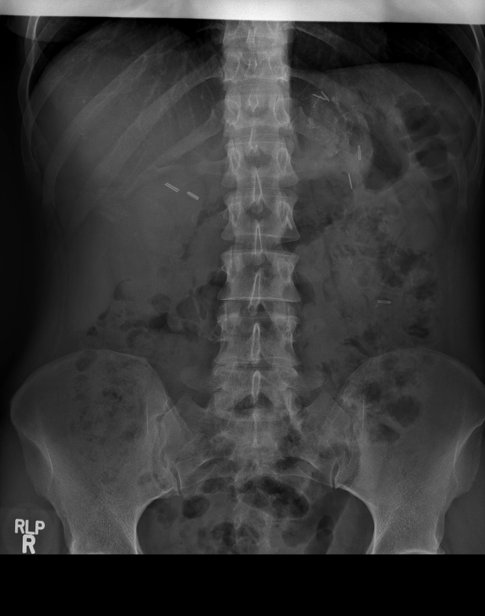

[Series 3: fluoro_barium 2fps_bw · 0.19mm/px · 1 of 1 slices shown (1 of 14)]
[im 1/1]
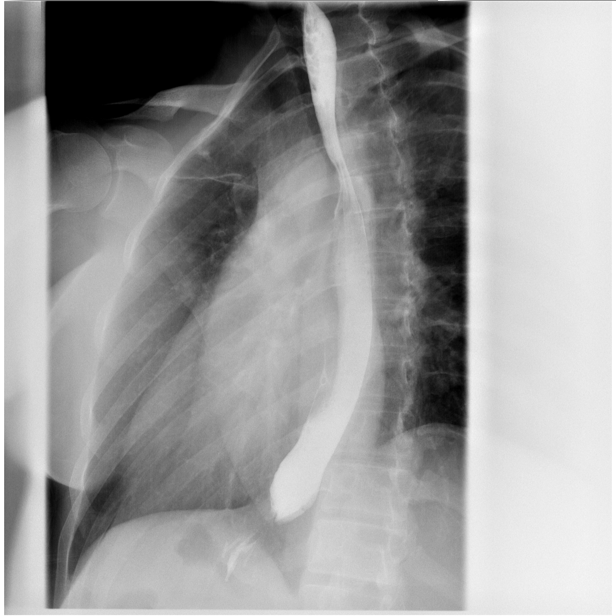

[Series 5: fluoro_barium 2fps_bw · 0.19mm/px · 1 of 1 slices shown (2 of 14)]
[im 1/1]
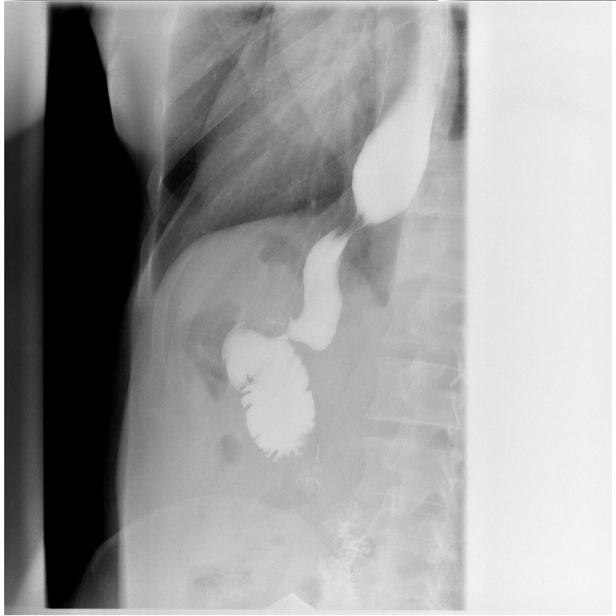

[Series 6: fluoro_barium 2fps_bw · 0.19mm/px · 1 of 1 slices shown (3 of 14)]
[im 1/1]
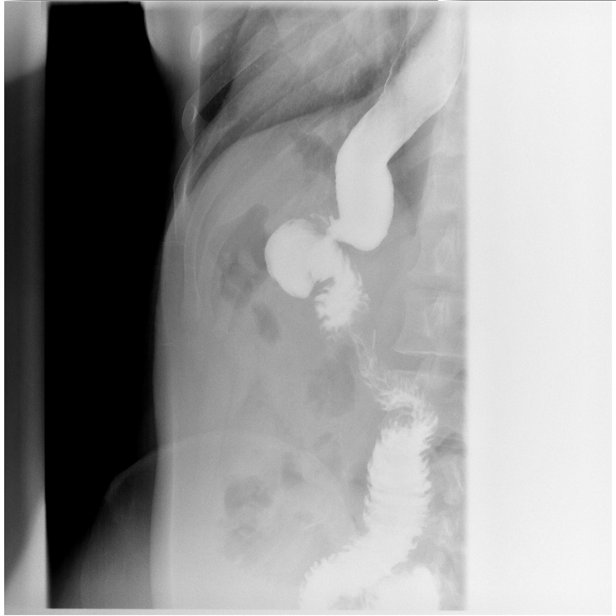

[Series 8: fluoro_barium 2fps_bw · 0.19mm/px · 1 of 1 slices shown (4 of 14)]
[im 1/1]
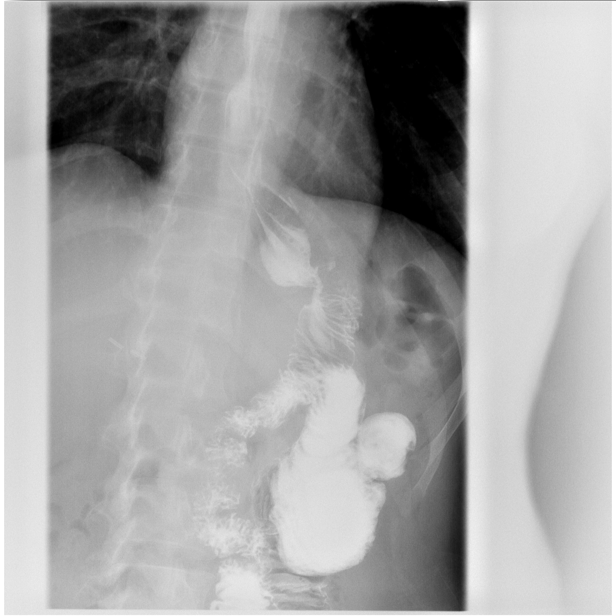

[Series 9: fluoro_barium 2fps_bw · 0.19mm/px · 1 of 1 slices shown (5 of 14)]
[im 1/1]
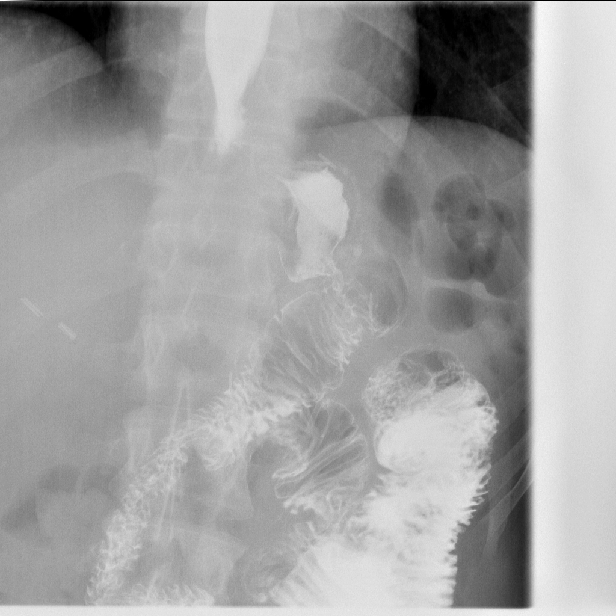

[Series 11: fluoro_barium 2fps_bw · 0.19mm/px · 1 of 1 slices shown (6 of 14)]
[im 1/1]
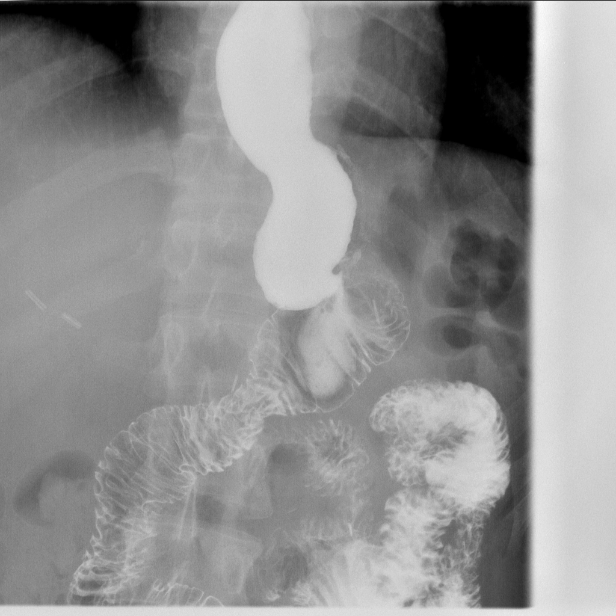

[Series 13: fluoro_barium 2fps_bw · 0.19mm/px · 1 of 1 slices shown (7 of 14)]
[im 1/1]
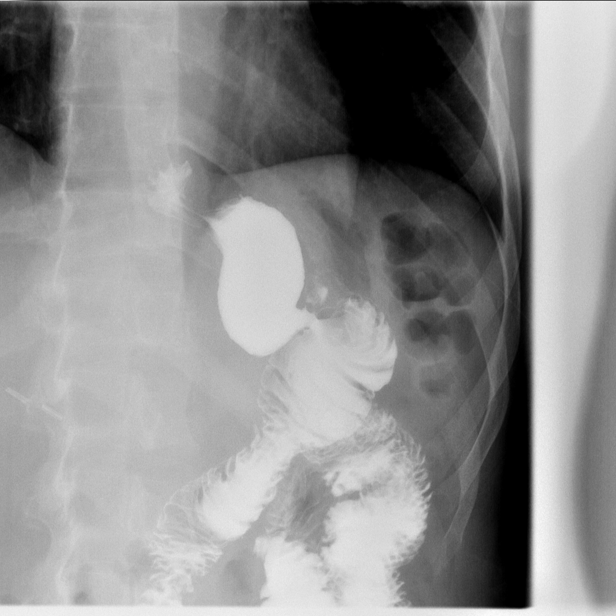

[Series 14: fluoro_barium 2fps_bw · 0.19mm/px · 1 of 1 slices shown (8 of 14)]
[im 1/1]
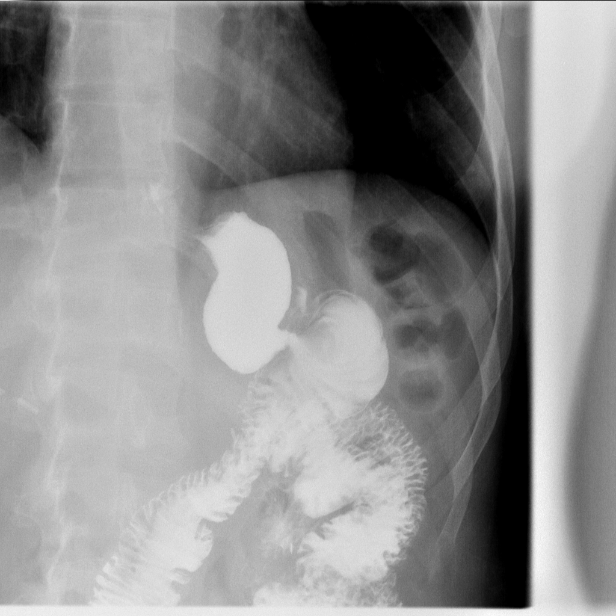

[Series 16: fluoro_barium 2fps_bw · 0.18mm/px · 1 of 1 slices shown (9 of 14)]
[im 1/1]
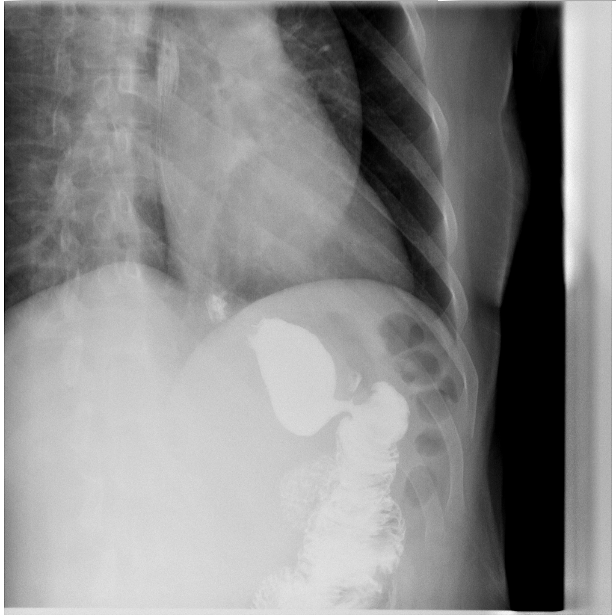

[Series 17: fluoro_barium 2fps_bw · 0.19mm/px · 1 of 1 slices shown (10 of 14)]
[im 1/1]
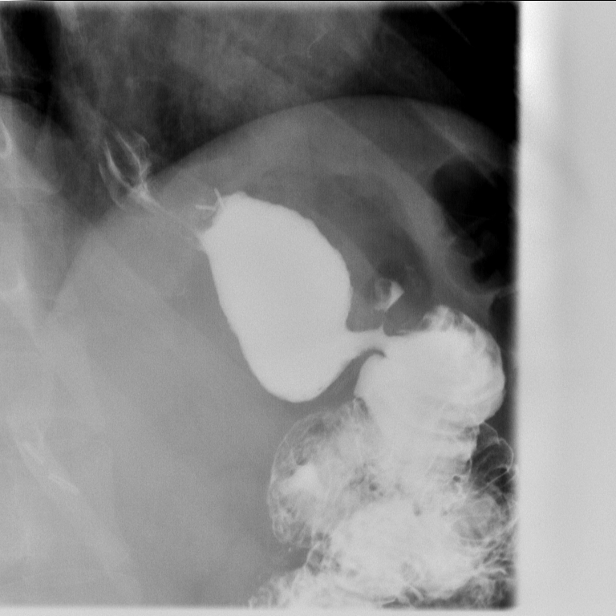

[Series 19: fluoro_barium 2fps_bw · 0.19mm/px · 1 of 1 slices shown (11 of 14)]
[im 1/1]
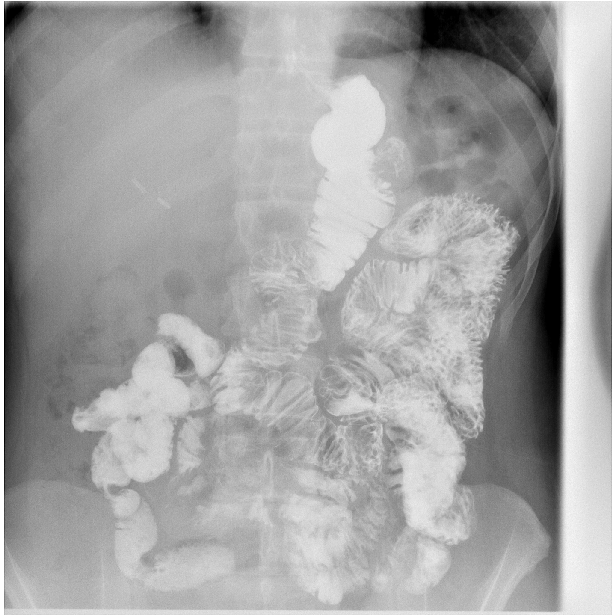

[Series 21: fluoro_barium 2fps_bw · 0.18mm/px · 1 of 1 slices shown (12 of 14)]
[im 1/1]
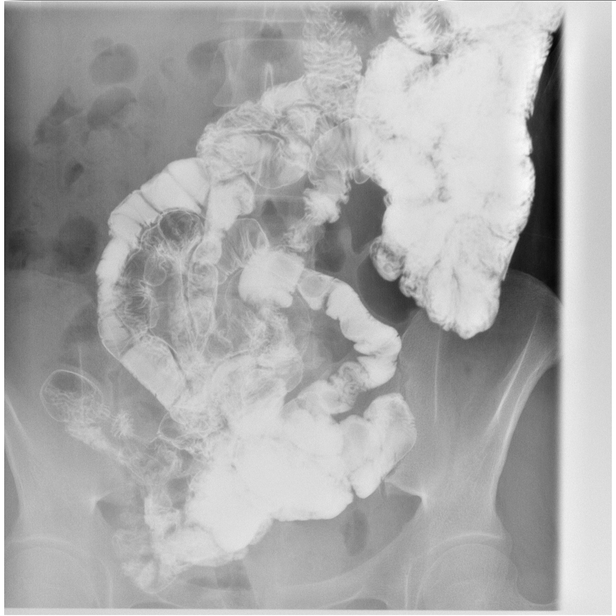

[Series 22: fluoro_barium 2fps_bw · 0.18mm/px · 1 of 1 slices shown (13 of 14)]
[im 1/1]
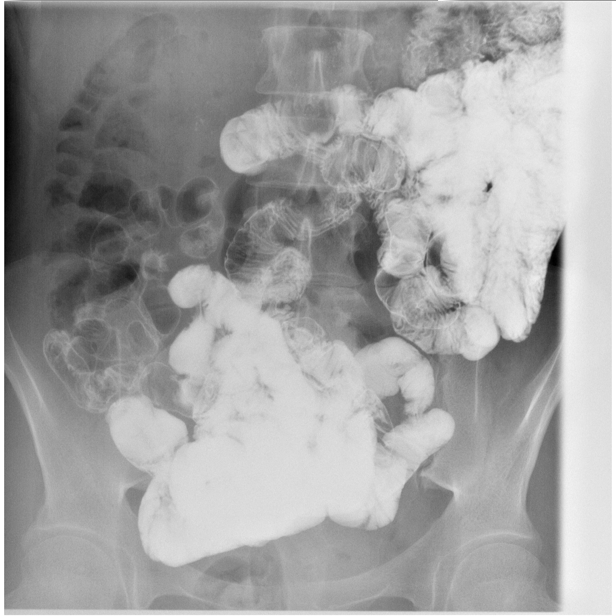

[Series 24: fluoro_barium 2fps_bw · 0.18mm/px · 1 of 1 slices shown (14 of 14)]
[im 1/1]
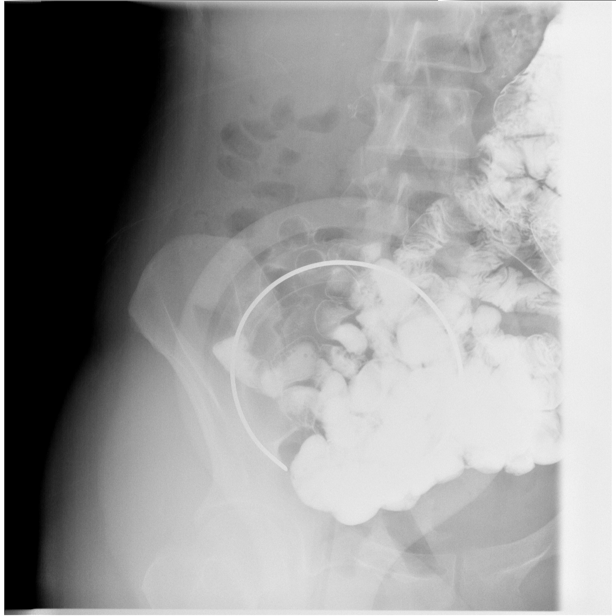

[15 of 24 positions shown; findings below may reference images not displayed]

FINDINGS: Thoracic esophagus is unremarkable. Narrowing of the gastroenteric
anastomosis is noted. A mild stricture cannot be excluded. Adjacent
small collection of barium is noted. Ulceration at the anastomosis
cannot be excluded. Proximal and distal small bowel are normal. No
bowel obstruction. Terminal ileum is normal.
IMPRESSION: 1. Gastroenteric anastomosis mild narrowing is noted. Stricture
cannot be excluded.
2. Small collection of barium is noted at the gastroenteric
anastomosis. This could represent a small ulcer.
3. Small-bowel normal.

## 2016-12-25 ENCOUNTER — Telehealth: Payer: Self-pay | Admitting: *Deleted

## 2016-12-25 DIAGNOSIS — D509 Iron deficiency anemia, unspecified: Secondary | ICD-10-CM

## 2016-12-25 NOTE — Telephone Encounter (Signed)
That's fine

## 2016-12-25 NOTE — Telephone Encounter (Signed)
Patient questing that a B 12 level be added to her labs on Wednesday, Please advise

## 2016-12-27 ENCOUNTER — Inpatient Hospital Stay: Payer: 59 | Attending: Oncology

## 2016-12-27 DIAGNOSIS — F429 Obsessive-compulsive disorder, unspecified: Secondary | ICD-10-CM | POA: Diagnosis not present

## 2016-12-27 DIAGNOSIS — E559 Vitamin D deficiency, unspecified: Secondary | ICD-10-CM | POA: Insufficient documentation

## 2016-12-27 DIAGNOSIS — F909 Attention-deficit hyperactivity disorder, unspecified type: Secondary | ICD-10-CM | POA: Diagnosis not present

## 2016-12-27 DIAGNOSIS — Z79899 Other long term (current) drug therapy: Secondary | ICD-10-CM | POA: Diagnosis not present

## 2016-12-27 DIAGNOSIS — M129 Arthropathy, unspecified: Secondary | ICD-10-CM | POA: Insufficient documentation

## 2016-12-27 DIAGNOSIS — E538 Deficiency of other specified B group vitamins: Secondary | ICD-10-CM | POA: Insufficient documentation

## 2016-12-27 DIAGNOSIS — Z9884 Bariatric surgery status: Secondary | ICD-10-CM | POA: Insufficient documentation

## 2016-12-27 DIAGNOSIS — R5383 Other fatigue: Secondary | ICD-10-CM | POA: Diagnosis not present

## 2016-12-27 DIAGNOSIS — F329 Major depressive disorder, single episode, unspecified: Secondary | ICD-10-CM | POA: Diagnosis not present

## 2016-12-27 DIAGNOSIS — F419 Anxiety disorder, unspecified: Secondary | ICD-10-CM | POA: Insufficient documentation

## 2016-12-27 DIAGNOSIS — F17210 Nicotine dependence, cigarettes, uncomplicated: Secondary | ICD-10-CM | POA: Diagnosis not present

## 2016-12-27 DIAGNOSIS — G4700 Insomnia, unspecified: Secondary | ICD-10-CM | POA: Diagnosis not present

## 2016-12-27 DIAGNOSIS — Z9049 Acquired absence of other specified parts of digestive tract: Secondary | ICD-10-CM | POA: Insufficient documentation

## 2016-12-27 DIAGNOSIS — D508 Other iron deficiency anemias: Secondary | ICD-10-CM

## 2016-12-27 DIAGNOSIS — R531 Weakness: Secondary | ICD-10-CM | POA: Insufficient documentation

## 2016-12-27 DIAGNOSIS — D509 Iron deficiency anemia, unspecified: Secondary | ICD-10-CM

## 2016-12-27 LAB — CBC WITH DIFFERENTIAL/PLATELET
Basophils Absolute: 0.1 10*3/uL (ref 0–0.1)
Basophils Relative: 1 %
EOS PCT: 3 %
Eosinophils Absolute: 0.2 10*3/uL (ref 0–0.7)
HEMATOCRIT: 44.8 % (ref 35.0–47.0)
HEMOGLOBIN: 15.3 g/dL (ref 12.0–16.0)
LYMPHS ABS: 2 10*3/uL (ref 1.0–3.6)
Lymphocytes Relative: 23 %
MCH: 33.8 pg (ref 26.0–34.0)
MCHC: 34.1 g/dL (ref 32.0–36.0)
MCV: 99.2 fL (ref 80.0–100.0)
Monocytes Absolute: 0.6 10*3/uL (ref 0.2–0.9)
Monocytes Relative: 7 %
NEUTROS PCT: 66 %
Neutro Abs: 6 10*3/uL (ref 1.4–6.5)
Platelets: 385 10*3/uL (ref 150–440)
RBC: 4.51 MIL/uL (ref 3.80–5.20)
RDW: 13.3 % (ref 11.5–14.5)
WBC: 8.9 10*3/uL (ref 3.6–11.0)

## 2016-12-27 LAB — IRON AND TIBC
Iron: 84 ug/dL (ref 28–170)
Saturation Ratios: 25 % (ref 10.4–31.8)
TIBC: 336 ug/dL (ref 250–450)
UIBC: 252 ug/dL

## 2016-12-27 LAB — FERRITIN: FERRITIN: 23 ng/mL (ref 11–307)

## 2016-12-27 LAB — VITAMIN B12: VITAMIN B 12: 383 pg/mL (ref 180–914)

## 2016-12-28 ENCOUNTER — Other Ambulatory Visit: Payer: 59

## 2016-12-29 NOTE — Progress Notes (Signed)
[place]  Telephone:(336) [phone] Fax:(336) [phone]  ID: NAKINA SPATZ OB: 1976/03/02  MR#: [id number]  ZYS#:[id number]  Patient Care Team: Harlan Stains, MD as PCP - General (Family Medicine) Harlan Stains, MD (Family Medicine)  CHIEF COMPLAINT:  Iron deficiency anemia  INTERVAL HISTORY: Patient returns to clinic today for repeat laboratory work and further evaluation.  She continues to have chronic weakness and fatigue.  She continues to have insomnia as well. She has no recent fevers or illnesses.  She has no neurologic complaints. She has a good appetite and denies weight loss. She denies any chest pain, shortness of breath, or cough.  She has no nausea, vomiting, constipation, or diarrhea. She denies any melena or hematochezia.  She has no urinary complaints.  Patient offers no further specific complaints today.  REVIEW OF SYSTEMS:   Review of Systems  Constitutional: Positive for malaise/fatigue. Negative for fever and weight loss.  Respiratory: Negative.  Negative for cough and shortness of breath.   Cardiovascular: Negative.  Negative for chest pain and leg swelling.  Gastrointestinal: Negative for abdominal pain, blood in stool and melena.  Genitourinary: Negative.   Musculoskeletal: Negative.   Skin: Negative.  Negative for rash.  Neurological: Positive for weakness. Negative for sensory change.  Psychiatric/Behavioral: The patient has insomnia. The patient is not nervous/anxious.     As per HPI. Otherwise, a complete review of systems is negative.  PAST MEDICAL HISTORY: Past Medical History:  Diagnosis Date  . ADHD (attention deficit hyperactivity disorder)   . Anemia    chronic anemia, recieves iron infusions periodically  . Anemia   . 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 Anxiety   . Arthritis   . Cubital tunnel syndrome on right   . Depression   . OCD (obsessive compulsive disorder)   . Vitamin D  deficiency     PAST SURGICAL HISTORY: Past Surgical History:  Procedure Laterality Date  . BACK SURGERY    . CHOLECYSTECTOMY    . ESOPHAGOGASTRODUODENOSCOPY (EGD) WITH PROPOFOL N/A 11/27/2014   Procedure: ESOPHAGOGASTRODUODENOSCOPY (EGD) WITH PROPOFOL;  Surgeon: Hulen Luster, MD;  Location: [facility] ENDOSCOPY;  Service: Gastroenterology;  Laterality: N/A;  . gastric bipass    . GASTRIC BYPASS  2008  . ORIF TIBIA & FIBULA FRACTURES    . right leg repaired    . transposition and decompression Right elbow  . ULNAR NERVE TRANSPOSITION Right 08/28/2014   Procedure: RIGHT ULNAR NERVE DECOMPRESSION/ AND ANTERIOR TRANSPOSITION;  Surgeon: Roseanne Kaufman, MD;  Location: [place];  Service: Orthopedics;  Laterality: Right;    FAMILY HISTORY: Reviewed and unchanged. No reported history of malignancy or chronic disease.     ADVANCED DIRECTIVES:    HEALTH MAINTENANCE: Social History  Substance Use Topics  . Smoking status: Current Every Day Smoker    Packs/day: 0.50  . Smokeless tobacco: Never Used  . Alcohol use No     Comment: social     Allergies  Allergen Reactions  . Penicillins Anaphylaxis  . Penicillins     Current Outpatient Prescriptions  Medication Sig Dispense Refill  . cyanocobalamin 1000 MCG tablet Take 100 mcg by mouth daily.     No current facility-administered medications for this visit.     OBJECTIVE: Vitals:   01/01/17 1341  BP: 128/86  Pulse: 81  Resp: 20  Temp: (!) 97.5 F (36.4 C)     Body mass index is 22.9 kg/m.    ECOG FS:0 -  Asymptomatic  General: Well-developed, well-nourished, no acute distress. Eyes: Pink conjunctiva, anicteric sclera. Lungs: Clear to auscultation bilaterally. Heart: Regular rate and rhythm. No rubs, murmurs, or gallops. Abdomen: Soft, nontender, nondistended. No organomegaly noted, normoactive bowel sounds. Musculoskeletal: No edema, cyanosis, or clubbing. Neuro: Alert, answering all questions appropriately.  Cranial nerves grossly intact. Skin: No rashes or petechiae noted. Psych: Normal affect.   LAB RESULTS:  Lab Results  Component Value Date   NA 137 11/24/2015   K 4.6 11/24/2015   CL 106 11/24/2015   CO2 26 11/24/2015   GLUCOSE 105 (H) 11/24/2015   BUN 8 11/24/2015   CREATININE 0.88 11/24/2015   CALCIUM 9.4 11/24/2015   PROT 7.5 11/24/2015   ALBUMIN 4.6 11/24/2015   AST 15 11/24/2015   ALT 10 (L) 11/24/2015   ALKPHOS 53 11/24/2015   BILITOT 0.4 11/24/2015   GFRNONAA >60 11/24/2015   GFRAA >60 11/24/2015    Lab Results  Component Value Date   WBC 8.9 12/27/2016   NEUTROABS 6.0 12/27/2016   HGB 15.3 12/27/2016   HCT 44.8 12/27/2016   MCV 99.2 12/27/2016   PLT 385 12/27/2016   Lab Results  Component Value Date   IRON 84 12/27/2016   TIBC 336 12/27/2016   IRONPCTSAT 25 12/27/2016    Lab Results  Component Value Date   FERRITIN 23 12/27/2016     STUDIES: No results found.  ASSESSMENT: Iron deficiency anemia.  PLAN:    1.  Iron deficiency anemia: Likely secondary to malabsorption status post gastric bypass.  Patient's hemoglobin and iron stores are now well within normal limits.  She does not require an infusion today.  She last received IV Feraheme on October 05, 2016.  Return to clinic in 6 months with repeat laboratory work and further evaluation.  2. Insomnia: Likely secondary to switching to third shift, monitor. 3.  B12 deficiency: Patient's B12 levels are now within normal limits.  Approximately 20 minutes was spent in discussion of which greater than 50% was consultation.  Patient expressed understanding and was in agreement with this plan. She also understands that She can call clinic at any time with any questions, concerns, or complaints.    Lloyd Huger, MD   01/01/2017 1:57 PM

## 2017-01-01 ENCOUNTER — Inpatient Hospital Stay (HOSPITAL_BASED_OUTPATIENT_CLINIC_OR_DEPARTMENT_OTHER): Payer: 59 | Admitting: Oncology

## 2017-01-01 ENCOUNTER — Inpatient Hospital Stay: Payer: 59

## 2017-01-01 VITALS — BP 128/86 | HR 81 | Temp 97.5°F | Resp 20 | Wt 135.5 lb

## 2017-01-01 DIAGNOSIS — R531 Weakness: Secondary | ICD-10-CM

## 2017-01-01 DIAGNOSIS — G4700 Insomnia, unspecified: Secondary | ICD-10-CM | POA: Diagnosis not present

## 2017-01-01 DIAGNOSIS — F329 Major depressive disorder, single episode, unspecified: Secondary | ICD-10-CM

## 2017-01-01 DIAGNOSIS — F419 Anxiety disorder, unspecified: Secondary | ICD-10-CM | POA: Diagnosis not present

## 2017-01-01 DIAGNOSIS — F909 Attention-deficit hyperactivity disorder, unspecified type: Secondary | ICD-10-CM

## 2017-01-01 DIAGNOSIS — F17210 Nicotine dependence, cigarettes, uncomplicated: Secondary | ICD-10-CM

## 2017-01-01 DIAGNOSIS — E538 Deficiency of other specified B group vitamins: Secondary | ICD-10-CM | POA: Diagnosis not present

## 2017-01-01 DIAGNOSIS — E559 Vitamin D deficiency, unspecified: Secondary | ICD-10-CM

## 2017-01-01 DIAGNOSIS — R5383 Other fatigue: Secondary | ICD-10-CM | POA: Diagnosis not present

## 2017-01-01 DIAGNOSIS — M129 Arthropathy, unspecified: Secondary | ICD-10-CM

## 2017-01-01 DIAGNOSIS — F429 Obsessive-compulsive disorder, unspecified: Secondary | ICD-10-CM | POA: Diagnosis not present

## 2017-01-01 DIAGNOSIS — Z9884 Bariatric surgery status: Secondary | ICD-10-CM

## 2017-01-01 DIAGNOSIS — D508 Other iron deficiency anemias: Secondary | ICD-10-CM

## 2017-01-01 DIAGNOSIS — D509 Iron deficiency anemia, unspecified: Secondary | ICD-10-CM | POA: Diagnosis not present

## 2017-01-01 DIAGNOSIS — Z9049 Acquired absence of other specified parts of digestive tract: Secondary | ICD-10-CM

## 2017-01-01 DIAGNOSIS — Z79899 Other long term (current) drug therapy: Secondary | ICD-10-CM

## 2017-01-01 NOTE — Progress Notes (Signed)
Patient reports feeling tired today.

## 2017-02-28 DIAGNOSIS — H52223 Regular astigmatism, bilateral: Secondary | ICD-10-CM | POA: Diagnosis not present

## 2017-02-28 DIAGNOSIS — H5203 Hypermetropia, bilateral: Secondary | ICD-10-CM | POA: Diagnosis not present

## 2017-02-28 DIAGNOSIS — H18613 Keratoconus, stable, bilateral: Secondary | ICD-10-CM | POA: Diagnosis not present

## 2017-04-22 DIAGNOSIS — J1089 Influenza due to other identified influenza virus with other manifestations: Secondary | ICD-10-CM | POA: Diagnosis not present

## 2017-07-01 NOTE — Progress Notes (Deleted)
Leah Herrera  [phone] [phone]  ID: Leah Herrera OB: 1975-05-06  MR#: [id number]  SEG#:[id number]  Patient Care Team: Harlan Stains, MD as PCP - General (Family Medicine) Harlan Stains, MD (Family Medicine)  CHIEF COMPLAINT:  Iron deficiency anemia  INTERVAL HISTORY: Patient returns to clinic today for repeat laboratory work and further evaluation.  She continues to have chronic weakness and fatigue.  She continues to have insomnia as well. She has no recent fevers or illnesses.  She has no neurologic complaints. She has a good appetite and denies weight loss. She denies any chest pain, shortness of breath, or cough.  She has no nausea, vomiting, constipation, or diarrhea. She denies any melena or hematochezia.  She has no urinary complaints.  Patient offers no further specific complaints today.  REVIEW OF SYSTEMS:   Review of Systems  Constitutional: Positive for malaise/fatigue. Negative for fever and weight loss.  Respiratory: Negative.  Negative for cough and shortness of breath.   Cardiovascular: Negative.  Negative for chest pain and leg swelling.  Gastrointestinal: Negative for abdominal pain, blood in stool and melena.  Genitourinary: Negative.   Musculoskeletal: Negative.   Skin: Negative.  Negative for rash.  Neurological: Positive for weakness. Negative for sensory change.  Psychiatric/Behavioral: The patient has insomnia. The patient is not nervous/anxious.     As per HPI. Otherwise, a complete review of systems is negative.  PAST MEDICAL HISTORY: Past Medical History:  Diagnosis Date  . ADHD (attention deficit hyperactivity disorder)   . Anemia    chronic anemia, recieves iron infusions periodically  . Anemia   . 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 Anxiety   . Arthritis   . Cubital tunnel syndrome on right   . Depression   . OCD (obsessive compulsive disorder)   . Vitamin D  deficiency     PAST SURGICAL HISTORY: Past Surgical History:  Procedure Laterality Date  . BACK SURGERY    . CHOLECYSTECTOMY    . ESOPHAGOGASTRODUODENOSCOPY (EGD) WITH PROPOFOL N/A 11/27/2014   Procedure: ESOPHAGOGASTRODUODENOSCOPY (EGD) WITH PROPOFOL;  Surgeon: Hulen Luster, MD;  Location: [facility] ENDOSCOPY;  Service: Gastroenterology;  Laterality: N/A;  . gastric bipass    . GASTRIC BYPASS  2008  . ORIF TIBIA & FIBULA FRACTURES    . right leg repaired    . transposition and decompression Right elbow  . ULNAR NERVE TRANSPOSITION Right 08/28/2014   Procedure: RIGHT ULNAR NERVE DECOMPRESSION/ AND ANTERIOR TRANSPOSITION;  Surgeon: Roseanne Kaufman, MD;  Location: [place];  Service: Orthopedics;  Laterality: Right;    FAMILY HISTORY: Reviewed and unchanged. No reported history of malignancy or chronic disease.     ADVANCED DIRECTIVES:    HEALTH MAINTENANCE: Social History   Tobacco Use  . Smoking status: Current Every Day Smoker    Packs/day: 0.50  . Smokeless tobacco: Never Used  Substance Use Topics  . Alcohol use: No    Comment: social  . Drug use: No     Allergies  Allergen Reactions  . Penicillins Anaphylaxis  . Penicillins     Current Outpatient Medications  Medication Sig Dispense Refill  . cyanocobalamin 1000 MCG tablet Take 100 mcg by mouth daily.     No current facility-administered medications for this visit.     OBJECTIVE: There were no vitals filed for this visit.   There is no height or weight on file to calculate BMI.    ECOG FS:0 - Asymptomatic  General: Well-developed, well-nourished, no acute distress. Eyes: Pink conjunctiva, anicteric sclera. Lungs: Clear to auscultation bilaterally. Heart: Regular rate and rhythm. No rubs, murmurs, or gallops. Abdomen: Soft, nontender, nondistended. No organomegaly noted, normoactive bowel sounds. Musculoskeletal: No edema, cyanosis, or clubbing. Neuro: Alert, answering all questions appropriately.  Cranial nerves grossly intact. Skin: No rashes or petechiae noted. Psych: Normal affect.   LAB RESULTS:  Lab Results  Component Value Date   NA 137 11/24/2015   K 4.6 11/24/2015   CL 106 11/24/2015   CO2 26 11/24/2015   GLUCOSE 105 (H) 11/24/2015   BUN 8 11/24/2015   CREATININE 0.88 11/24/2015   CALCIUM 9.4 11/24/2015   PROT 7.5 11/24/2015   ALBUMIN 4.6 11/24/2015   AST 15 11/24/2015   ALT 10 (L) 11/24/2015   ALKPHOS 53 11/24/2015   BILITOT 0.4 11/24/2015   GFRNONAA >60 11/24/2015   GFRAA >60 11/24/2015    Lab Results  Component Value Date   WBC 8.9 12/27/2016   NEUTROABS 6.0 12/27/2016   HGB 15.3 12/27/2016   HCT 44.8 12/27/2016   MCV 99.2 12/27/2016   PLT 385 12/27/2016   Lab Results  Component Value Date   IRON 84 12/27/2016   TIBC 336 12/27/2016   IRONPCTSAT 25 12/27/2016    Lab Results  Component Value Date   FERRITIN 23 12/27/2016     STUDIES: No results found.  ASSESSMENT: Iron deficiency anemia.  PLAN:    1.  Iron deficiency anemia: Likely secondary to malabsorption status post gastric bypass.  Patient's hemoglobin and iron stores are now well within normal limits.  She does not require an infusion today.  She last received IV Feraheme on October 05, 2016.  Return to clinic in 6 months with repeat laboratory work and further evaluation.  2. Insomnia: Likely secondary to switching to third shift, monitor. 3.  B12 deficiency: Patient's B12 levels are now within normal limits.  Approximately 20 minutes was spent in discussion of which greater than 50% was consultation.  Patient expressed understanding and was in agreement with this plan. She also understands that She can call clinic at any time with any questions, concerns, or complaints.    Lloyd Huger, MD   07/01/2017 9:28 AM

## 2017-07-02 ENCOUNTER — Inpatient Hospital Stay: Payer: 59

## 2017-07-05 ENCOUNTER — Inpatient Hospital Stay: Payer: 59 | Admitting: Oncology

## 2017-08-28 DIAGNOSIS — Z79899 Other long term (current) drug therapy: Secondary | ICD-10-CM | POA: Diagnosis not present

## 2017-09-11 DIAGNOSIS — R1013 Epigastric pain: Secondary | ICD-10-CM | POA: Diagnosis not present

## 2017-09-11 DIAGNOSIS — R112 Nausea with vomiting, unspecified: Secondary | ICD-10-CM | POA: Diagnosis not present

## 2017-09-11 DIAGNOSIS — R1310 Dysphagia, unspecified: Secondary | ICD-10-CM | POA: Diagnosis not present

## 2017-09-12 ENCOUNTER — Ambulatory Visit: Payer: 59 | Admitting: Anesthesiology

## 2017-09-12 ENCOUNTER — Encounter
Admission: RE | Disposition: A | Discharge status: Home / Self Care | Payer: Self-pay | Source: Ambulatory Visit | Attending: Internal Medicine

## 2017-09-12 ENCOUNTER — Ambulatory Visit
Admission: RE | Admit: 2017-09-12 | Discharge: 2017-09-12 | Disposition: A | Discharge status: Home / Self Care | Payer: 59 | Source: Ambulatory Visit | Attending: Internal Medicine | Admitting: Internal Medicine

## 2017-09-12 ENCOUNTER — Encounter: Payer: Self-pay | Admitting: Student

## 2017-09-12 ENCOUNTER — Other Ambulatory Visit: Payer: Self-pay

## 2017-09-12 DIAGNOSIS — K449 Diaphragmatic hernia without obstruction or gangrene: Secondary | ICD-10-CM | POA: Diagnosis not present

## 2017-09-12 DIAGNOSIS — K9189 Other postprocedural complications and disorders of digestive system: Secondary | ICD-10-CM | POA: Diagnosis not present

## 2017-09-12 DIAGNOSIS — Z79899 Other long term (current) drug therapy: Secondary | ICD-10-CM | POA: Insufficient documentation

## 2017-09-12 DIAGNOSIS — Z9884 Bariatric surgery status: Secondary | ICD-10-CM | POA: Insufficient documentation

## 2017-09-12 DIAGNOSIS — Z79891 Long term (current) use of opiate analgesic: Secondary | ICD-10-CM | POA: Insufficient documentation

## 2017-09-12 DIAGNOSIS — R1013 Epigastric pain: Secondary | ICD-10-CM | POA: Diagnosis not present

## 2017-09-12 DIAGNOSIS — F17200 Nicotine dependence, unspecified, uncomplicated: Secondary | ICD-10-CM | POA: Diagnosis not present

## 2017-09-12 DIAGNOSIS — R1310 Dysphagia, unspecified: Secondary | ICD-10-CM | POA: Diagnosis not present

## 2017-09-12 DIAGNOSIS — K56609 Unspecified intestinal obstruction, unspecified as to partial versus complete obstruction: Secondary | ICD-10-CM | POA: Diagnosis not present

## 2017-09-12 HISTORY — PX: ESOPHAGOGASTRODUODENOSCOPY (EGD) WITH PROPOFOL: SHX5813

## 2017-09-12 LAB — POCT PREGNANCY, URINE: Preg Test, Ur: NEGATIVE

## 2017-09-12 SURGERY — ESOPHAGOGASTRODUODENOSCOPY (EGD) WITH PROPOFOL
Anesthesia: General

## 2017-09-12 MED ORDER — SODIUM CHLORIDE 0.9 % IV SOLN
INTRAVENOUS | Status: DC
  Administered 2017-09-12: 15:00:00 via INTRAVENOUS

## 2017-09-12 MED ORDER — PROPOFOL 500 MG/50ML IV EMUL
INTRAVENOUS | Status: AC
  Filled 2017-09-12: qty 50

## 2017-09-12 MED ORDER — LIDOCAINE HCL (PF) 2 % IJ SOLN
INTRAMUSCULAR | Status: AC
  Filled 2017-09-12: qty 10

## 2017-09-12 MED ORDER — FENTANYL CITRATE (PF) 100 MCG/2ML IJ SOLN
INTRAMUSCULAR | Status: DC | PRN
  Administered 2017-09-12: 25 ug via INTRAVENOUS
  Administered 2017-09-12: 50 ug via INTRAVENOUS
  Administered 2017-09-12: 25 ug via INTRAVENOUS

## 2017-09-12 MED ORDER — FENTANYL CITRATE (PF) 100 MCG/2ML IJ SOLN
INTRAMUSCULAR | Status: AC
  Filled 2017-09-12: qty 2

## 2017-09-12 MED ORDER — PROPOFOL 500 MG/50ML IV EMUL
INTRAVENOUS | Status: DC | PRN
  Administered 2017-09-12: 100 ug/kg/min via INTRAVENOUS

## 2017-09-12 MED ORDER — MIDAZOLAM HCL 2 MG/2ML IJ SOLN
INTRAMUSCULAR | Status: AC
  Filled 2017-09-12: qty 2

## 2017-09-12 MED ORDER — MIDAZOLAM HCL 2 MG/2ML IJ SOLN
INTRAMUSCULAR | Status: DC | PRN
  Administered 2017-09-12: 2 mg via INTRAVENOUS

## 2017-09-12 NOTE — H&P (Signed)
Outpatient short stay form Pre-procedure 09/12/2017 2:24 PM Teodoro K. Alice Reichert, M.D.  Primary Physician: Harlan Stains, M.D.  Reason for visit:  Epigastric pain, nausea, vomiting.   History of present illness:  42 y/o female with personal hx of gastric bypass in 2008 and hx of anastomotic stricture requiring balloon dilation presents with at least 3 weeks of esophageal dysphagia, epigastric pain, nausea and vomiting.    No current facility-administered medications for this encounter.   Medications Prior to Admission  Medication Sig Dispense Refill Last Dose  . albuterol (PROVENTIL HFA) 108 (90 Base) MCG/ACT inhaler Inhale into the lungs every 6 (six) hours as needed for wheezing or shortness of breath.   Past Month at Unknown time  . omeprazole (PRILOSEC) 40 MG capsule Take 40 mg by mouth daily.   09/11/2017 at Unknown time  . ondansetron (ZOFRAN-ODT) 4 MG disintegrating tablet Take 4 mg by mouth every 8 (eight) hours as needed for nausea or vomiting.     . sucralfate (CARAFATE) 1 g tablet Take 1 g by mouth 4 (four) times daily -  with meals and at bedtime.     . celecoxib (CELEBREX) 200 MG capsule Take 200 mg by mouth 2 (two) times daily.   Completed Course at Unknown time  . cyanocobalamin 1000 MCG tablet Take 100 mcg by mouth daily.   Not Taking at Unknown time  . cyclobenzaprine (FLEXERIL) 10 MG tablet Take 10 mg by mouth 3 (three) times daily as needed for muscle spasms.   Completed Course at Unknown time  . metaxalone (SKELAXIN) 800 MG tablet Take 800 mg by mouth 3 (three) times daily.   Completed Course at Unknown time  . niacin 100 MG tablet Take 100 mg by mouth at bedtime.   Completed Course at Unknown time  . oxyCODONE (OXY IR/ROXICODONE) 5 MG immediate release tablet Take 5 mg by mouth every 4 (four) hours as needed for severe pain.   Completed Course at Unknown time     Allergies  Allergen Reactions  . Penicillins Anaphylaxis  . Penicillins      Past Medical History:   Diagnosis Date  . ADHD (attention deficit hyperactivity disorder)   . Anemia    chronic anemia, recieves iron infusions periodically  . Anemia   . 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 Anxiety   . Arthritis   . Cubital tunnel syndrome on right   . Depression   . OCD (obsessive compulsive disorder)   . Vitamin D deficiency     Review of systems:  Otherwise negative.    Physical Exam  Gen: Alert, oriented. Appears stated age.  HEENT: [place]/AT. PERRLA. Lungs: CTA, no wheezes. CV: RR nl S1, S2. Abd: soft, benign, no masses. BS+ Ext: No edema. Pulses 2+    Planned procedures: Proceed with colonoscopy. The patient understands the nature of the planned procedure, indications, risks, alternatives and potential complications including but not limited to bleeding, infection, perforation, damage to internal organs and possible oversedation/side effects from anesthesia. The patient agrees and gives consent to proceed.  Please refer to procedure notes for findings, recommendations and patient disposition/instructions.     Teodoro K. Alice Reichert, M.D. Gastroenterology 09/12/2017  2:24 PM

## 2017-09-12 NOTE — Transfer of Care (Signed)
   Immediate Anesthesia Transfer of Care Note  Patient: Leah Herrera  Procedure(s) Performed: ESOPHAGOGASTRODUODENOSCOPY (EGD) WITH PROPOFOL (N/A )  Patient Location: PACU  Anesthesia Type:General  Level of Consciousness: awake and sedated  Airway & Oxygen Therapy: Patient Spontanous Breathing and Patient connected to nasal cannula oxygen  Post-op Assessment: Report given to RN and Post -op Vital signs reviewed and stable  Post vital signs: Reviewed and stable  Last Vitals:  Vitals Value Taken Time  BP    Temp    Pulse    Resp    SpO2      Last Pain:  Vitals:   09/12/17 1430  TempSrc: Tympanic  PainSc: 0-No pain         Complications: No apparent anesthesia complications

## 2017-09-12 NOTE — Anesthesia Procedure Notes (Signed)
Performed by: Cook-Martin, Quintara Bost Pre-anesthesia Checklist: Patient identified, Emergency Drugs available, Suction available, Patient being monitored and Timeout performed Patient Re-evaluated:Patient Re-evaluated prior to induction Oxygen Delivery Method: Nasal cannula Preoxygenation: Pre-oxygenation with 100% oxygen Induction Type: IV induction Airway Equipment and Method: Bite block Placement Confirmation: positive ETCO2 and CO2 detector       

## 2017-09-12 NOTE — Interval H&P Note (Signed)
History and Physical Interval Note:  09/12/2017 2:26 PM  Leah Herrera  has presented today for surgery, with the diagnosis of DYSPHAGIA EPIG PAIN N&V  The various methods of treatment have been discussed with the patient and family. After consideration of risks, benefits and other options for treatment, the patient has consented to  Procedure(s): ESOPHAGOGASTRODUODENOSCOPY (EGD) WITH PROPOFOL (N/A) as a surgical intervention .  The patient's history has been reviewed, patient examined, no change in status, stable for surgery.  I have reviewed the patient's chart and labs.  Questions were answered to the patient's satisfaction.     [place], [place]

## 2017-09-12 NOTE — Anesthesia Post-op Follow-up Note (Signed)
Anesthesia QCDR form completed.        

## 2017-09-12 NOTE — Op Note (Signed)
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 Alice Reichert MD, MD Referring MD:         Harlan Stains (Referring MD) Medicines:            Propofol per Anesthesia Complications:        No immediate complications. Estimated blood loss:                        Minimal. Procedure:            Pre-Anesthesia Assessment:                       - The risks and benefits of the procedure and the                        sedation options and risks were discussed with the                        patient. All questions were answered and informed                        consent was obtained.                       - Patient identification and proposed procedure were                        verified prior to the procedure by the nurse. The                        procedure was verified in the procedure room.                       - ASA Grade Assessment: III - A patient with severe                        systemic disease.                       - After reviewing the risks and benefits, the patient                        was deemed in satisfactory condition to undergo the                        procedure.                       After obtaining informed consent, the endoscope was                        passed  under direct vision. Throughout the procedure,                        the patient's blood pressure, pulse, and oxygen                        saturations were monitored continuously. The Endoscope                        was introduced through the mouth, and advanced to the               third part of duodenum. The upper GI endoscopy was                        accomplished without difficulty. The patient tolerated                        the procedure well. Findings:      The examined esophagus was normal.      Evidence of a gastric bypass was found. A gastric pouch with a medium       size was found containing pill debris. The staple line appeared intact.       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 This was traversed after dilation.       The pouch-to-jejunum limb was characterized by healthy appearing mucosa.       The jejunojejunal anastomosis was characterized by healthy appearing       mucosa. The duodenum-to-jejunum limb was not examined as it could not be       reached.      A TTS dilator was passed through the scope. Dilation with a 12-15-10 mm       and a 12-13.5-15 mm anastomotic balloon dilator was performed at the       anastomosis.      A small hiatal hernia was present.      The exam was otherwise without abnormality. Impression:           - Normal esophagus.                       - Gastric bypass with a medium-sized pouch and intact                        staple line. 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                       - Small hiatal hernia.                       - The examination was otherwise normal.                       - Dilation performed at the anastomosis.                       - No specimens collected. Recommendation:       - Patient has a contact number available for  emergencies. The signs and symptoms of potential                        delayed complications were discussed with the patient.                        Return to normal activities tomorrow. Written discharge                        instructions were provided to the patient.                       -  Resume previous diet.                       - Continue present medications.                       - Repeat upper endoscopy PRN for retreatment.                       - Return to physician assistant in 6 weeks.                       - The findings and recommendations were discussed with                        the patient and their family. Procedure Code(s):    --- Professional ---                       [phone], Esophagogastroduodenoscopy, flexible, transoral;                        with dilation of gastric/duodenal stricture(s) (eg,                        balloon, bougie) Diagnosis Code(s):    --- Professional ---                       R11.2, Nausea with vomiting, unspecified                       R10.13, Epigastric pain                       K44.9, Diaphragmatic hernia without obstruction or                        gangrene                       Z98.84, Bariatric surgery status                       R91.63, Other complications of other bariatric procedure CPT copyright 2017 American Medical Association. All rights reserved. The codes documented in this report are preliminary and upon coder review may  be revised to meet current compliance requirements. Efrain Sella MD, MD 09/12/2017 3:45:11 PM This report has been signed electronically. Number of Addenda: 0 Note Initiated On: 09/12/2017 3:20 PM      [facility]

## 2017-09-12 NOTE — Anesthesia Preprocedure Evaluation (Addendum)
Anesthesia Evaluation  Patient identified by MRN, date of birth, ID band Patient awake    Reviewed: Allergy & Precautions, H&P , NPO status , reviewed documented beta blocker date and time   Airway Mallampati: II  TM Distance: >3 FB Neck ROM: full    Dental  (+) Teeth Intact, Chipped   Pulmonary Current Smoker,    Pulmonary exam normal        Cardiovascular Normal cardiovascular exam     Neuro/Psych PSYCHIATRIC DISORDERS Anxiety Depression  Neuromuscular disease    GI/Hepatic   Endo/Other    Renal/GU      Musculoskeletal  (+) Arthritis ,   Abdominal   Peds  Hematology  (+) anemia ,   Anesthesia Other Findings Past Medical History: No date: ADHD (attention deficit hyperactivity disorder) No date: Anemia     Comment:  chronic anemia, recieves iron infusions periodically No date: Anemia 03/31/2011: 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  ([place]) No date: Anxiety No date: Arthritis No date: Cubital tunnel syndrome on right No date: Depression No date: OCD (obsessive compulsive disorder) No date: Vitamin D deficiency  Past Surgical History: No date: BACK SURGERY No date: CHOLECYSTECTOMY 11/27/2014: ESOPHAGOGASTRODUODENOSCOPY (EGD) WITH PROPOFOL; N/A     Comment:  Procedure: ESOPHAGOGASTRODUODENOSCOPY (EGD) WITH               PROPOFOL;  Surgeon: Hulen Luster, MD;  Location: [facility]               ENDOSCOPY;  Service: Gastroenterology;  Laterality: N/A; No date: gastric bipass 2008: GASTRIC BYPASS No date: ORIF TIBIA & FIBULA FRACTURES No date: right leg repaired elbow: transposition and decompression; Right 08/28/2014: ULNAR NERVE TRANSPOSITION; Right     Comment:  Procedure: RIGHT ULNAR NERVE DECOMPRESSION/ AND ANTERIOR              TRANSPOSITION;  Surgeon: Roseanne Kaufman, MD;  Location:               [place];  Service: Orthopedics;                Laterality: Right;      Reproductive/Obstetrics                           Anesthesia Physical Anesthesia Plan  ASA: III  Anesthesia Plan: General   Post-op Pain Management:    Induction:   PONV Risk Score and Plan: 2 and Treatment may vary due to age or medical condition and TIVA  Airway Management Planned:   Additional Equipment:   Intra-op Plan:   Post-operative Plan:   Informed Consent: I have reviewed the patients History and Physical, chart, labs and discussed the procedure including the risks, benefits and alternatives for the proposed anesthesia with the patient or authorized representative who has indicated his/her understanding and acceptance.   Dental Advisory Given  Plan Discussed with: CRNA  Anesthesia Plan Comments:        Anesthesia Quick Evaluation

## 2017-09-15 NOTE — Anesthesia Postprocedure Evaluation (Signed)
Anesthesia Post Note  Patient: Leah Herrera  Procedure(s) Performed: ESOPHAGOGASTRODUODENOSCOPY (EGD) WITH PROPOFOL (N/A )  Patient location during evaluation: Endoscopy Anesthesia Type: General Level of consciousness: awake and alert Pain management: pain level controlled Vital Signs Assessment: post-procedure vital signs reviewed and stable Respiratory status: spontaneous breathing, nonlabored ventilation, respiratory function stable and patient connected to nasal cannula oxygen Cardiovascular status: blood pressure returned to baseline and stable Postop Assessment: no apparent nausea or vomiting Anesthetic complications: no     Last Vitals:  Vitals:   09/12/17 1430 09/12/17 1540  BP: 137/87 (!) 141/83  Pulse: 80 62  Resp: 19 13  Temp: (!) 35.9 C (!) 35.7 C  SpO2: 100% 100%    Last Pain:  Vitals:   09/12/17 1610  TempSrc:   PainSc: 0-No pain                 Martha Clan

## 2017-09-16 ENCOUNTER — Encounter: Payer: Self-pay | Admitting: Internal Medicine

## 2017-09-20 ENCOUNTER — Inpatient Hospital Stay: Payer: 59

## 2017-09-20 ENCOUNTER — Inpatient Hospital Stay: Payer: 59 | Attending: Oncology | Admitting: Oncology

## 2017-09-20 ENCOUNTER — Encounter: Payer: Self-pay | Admitting: Oncology

## 2017-09-20 VITALS — BP 135/97 | HR 75 | Temp 97.5°F | Resp 16 | Wt 124.0 lb

## 2017-09-20 VITALS — BP 122/70 | HR 74 | Resp 18

## 2017-09-20 DIAGNOSIS — Z9884 Bariatric surgery status: Secondary | ICD-10-CM | POA: Insufficient documentation

## 2017-09-20 DIAGNOSIS — F909 Attention-deficit hyperactivity disorder, unspecified type: Secondary | ICD-10-CM | POA: Diagnosis not present

## 2017-09-20 DIAGNOSIS — E559 Vitamin D deficiency, unspecified: Secondary | ICD-10-CM | POA: Diagnosis not present

## 2017-09-20 DIAGNOSIS — F17210 Nicotine dependence, cigarettes, uncomplicated: Secondary | ICD-10-CM | POA: Diagnosis not present

## 2017-09-20 DIAGNOSIS — Z79899 Other long term (current) drug therapy: Secondary | ICD-10-CM | POA: Diagnosis not present

## 2017-09-20 DIAGNOSIS — R5383 Other fatigue: Secondary | ICD-10-CM | POA: Diagnosis not present

## 2017-09-20 DIAGNOSIS — F418 Other specified anxiety disorders: Secondary | ICD-10-CM | POA: Diagnosis not present

## 2017-09-20 DIAGNOSIS — R531 Weakness: Secondary | ICD-10-CM | POA: Insufficient documentation

## 2017-09-20 DIAGNOSIS — D508 Other iron deficiency anemias: Secondary | ICD-10-CM

## 2017-09-20 DIAGNOSIS — G4700 Insomnia, unspecified: Secondary | ICD-10-CM | POA: Diagnosis not present

## 2017-09-20 DIAGNOSIS — D509 Iron deficiency anemia, unspecified: Secondary | ICD-10-CM | POA: Diagnosis not present

## 2017-09-20 DIAGNOSIS — F429 Obsessive-compulsive disorder, unspecified: Secondary | ICD-10-CM | POA: Diagnosis not present

## 2017-09-20 MED ORDER — SODIUM CHLORIDE 0.9 % IV SOLN
INTRAVENOUS | Status: AC
  Administered 2017-09-20: 13:00:00 via INTRAVENOUS
  Filled 2017-09-20: qty 1000

## 2017-09-20 MED ORDER — SODIUM CHLORIDE 0.9 % IV SOLN
510.0000 mg | Freq: Once | INTRAVENOUS | Status: AC
  Administered 2017-09-20: 510 mg via INTRAVENOUS
  Filled 2017-09-20: qty 17

## 2017-09-20 NOTE — Progress Notes (Signed)
[place]  Telephone:([phone]) [phone] Fax:(336) [phone]  ID: Leah Herrera OB: Oct 05, 1975  MR#: [id number]  GEX#:[id number]  Patient Care Team: Harlan Stains, MD as PCP - General (Family Medicine) Harlan Stains, MD (Family Medicine)  CHIEF COMPLAINT:  Iron deficiency anemia  INTERVAL HISTORY: Patient returns to clinic today for repeat laboratory work and further evaluation of her iron deficiency anemia.  She has significant fatigue and weakness today.  States this is been going on for the past few weeks.  She also complains of continued insomnia.  She does deny any recent fevers, illnesses, neurological complaints.  She maintains a good appetite and denies weight loss.  Denies chest pain, shortness of breath, cough, nausea, vomiting, constipation or diarrhea.  She denies any melena or hematochezia.  She has no urinary complaints.  REVIEW OF SYSTEMS:   Review of Systems  Constitutional: Positive for malaise/fatigue. Negative for chills, fever and weight loss.  HENT: Negative for congestion and ear pain.   Eyes: Negative.  Negative for blurred vision and double vision.  Respiratory: Negative.  Negative for cough, sputum production and shortness of breath.   Cardiovascular: Negative.  Negative for chest pain, palpitations and leg swelling.  Gastrointestinal: Negative.  Negative for abdominal pain, constipation, diarrhea, nausea and vomiting.  Genitourinary: Negative for dysuria, frequency and urgency.  Musculoskeletal: Negative for back pain and falls.  Skin: Negative.  Negative for rash.  Neurological: Positive for weakness. Negative for headaches.  Endo/Heme/Allergies: Negative.  Does not bruise/bleed easily.  Psychiatric/Behavioral: Negative.  Negative for depression. The patient is not nervous/anxious and does not have insomnia.     As per HPI. Otherwise, a complete review of systems is negative.  PAST MEDICAL HISTORY: Past Medical History:  Diagnosis Date    . ADHD (attention deficit hyperactivity disorder)   . Anemia    chronic anemia, recieves iron infusions periodically  . Anemia   . 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 Anxiety   . Arthritis   . Cubital tunnel syndrome on right   . Depression   . OCD (obsessive compulsive disorder)   . Vitamin D deficiency     PAST SURGICAL HISTORY: Past Surgical History:  Procedure Laterality Date  . BACK SURGERY    . CHOLECYSTECTOMY    . ESOPHAGOGASTRODUODENOSCOPY (EGD) WITH PROPOFOL N/A 11/27/2014   Procedure: ESOPHAGOGASTRODUODENOSCOPY (EGD) WITH PROPOFOL;  Surgeon: Hulen Luster, MD;  [facility];  Service: Gastroenterology;  Laterality: N/A;  . ESOPHAGOGASTRODUODENOSCOPY (EGD) WITH PROPOFOL N/A 09/12/2017   Procedure: ESOPHAGOGASTRODUODENOSCOPY (EGD) WITH PROPOFOL;  Surgeon: Toledo, Benay Pike, MD;  Location: [facility] ENDOSCOPY;  Service: Gastroenterology;  Laterality: N/A;  . gastric bipass    . GASTRIC BYPASS  2008  . ORIF TIBIA & FIBULA FRACTURES    . right leg repaired    . transposition and decompression Right elbow  . ULNAR NERVE TRANSPOSITION Right 08/28/2014   Procedure: RIGHT ULNAR NERVE DECOMPRESSION/ AND ANTERIOR TRANSPOSITION;  Surgeon: Roseanne Kaufman, MD;  Location: [place];  Service: Orthopedics;  Laterality: Right;    FAMILY HISTORY: Reviewed and unchanged. No reported history of malignancy or chronic disease.     ADVANCED DIRECTIVES:    HEALTH MAINTENANCE: Social History   Tobacco Use  . Smoking status: Current Every Day Smoker    Packs/day: 1.00    Types: Cigarettes  . Smokeless tobacco: Never Used  Substance Use Topics  . Alcohol use: No    Comment: social  . Drug use:  No     Allergies  Allergen Reactions  . Penicillins Anaphylaxis  . Penicillins     Current Outpatient Medications  Medication Sig Dispense Refill  . albuterol (PROVENTIL HFA) 108 (90 Base) MCG/ACT inhaler Inhale into the lungs  every 6 (six) hours as needed for wheezing or shortness of breath.    . celecoxib (CELEBREX) 200 MG capsule Take 200 mg by mouth 2 (two) times daily.    . cyanocobalamin 1000 MCG tablet Take 100 mcg by mouth daily.    . cyclobenzaprine (FLEXERIL) 10 MG tablet Take 10 mg by mouth 3 (three) times daily as needed for muscle spasms.    . metaxalone (SKELAXIN) 800 MG tablet Take 800 mg by mouth 3 (three) times daily.    . niacin 100 MG tablet Take 100 mg by mouth at bedtime.    Marland Kitchen omeprazole (PRILOSEC) 40 MG capsule Take 40 mg by mouth daily.    . ondansetron (ZOFRAN-ODT) 4 MG disintegrating tablet Take 4 mg by mouth every 8 (eight) hours as needed for nausea or vomiting.    Marland Kitchen oxyCODONE (OXY IR/ROXICODONE) 5 MG immediate release tablet Take 5 mg by mouth every 4 (four) hours as needed for severe pain.    Marland Kitchen sucralfate (CARAFATE) 1 g tablet Take 1 g by mouth 4 (four) times daily -  with meals and at bedtime.     No current facility-administered medications for this visit.     OBJECTIVE: There were no vitals filed for this visit.   There is no height or weight on file to calculate BMI.    ECOG FS:0 - Asymptomatic  Physical Exam  Constitutional: She is oriented to person, place, and time. Vital signs are normal. She appears well-developed and well-nourished.  HENT:  Head: Normocephalic and atraumatic.  Eyes: Pupils are equal, round, and reactive to light.  Neck: Normal range of motion.  Cardiovascular: Normal rate, regular rhythm and normal heart sounds.  No murmur heard. Pulmonary/Chest: Effort normal and breath sounds normal. She has no wheezes.  Abdominal: Soft. Normal appearance and bowel sounds are normal. She exhibits no distension. There is no tenderness.  Musculoskeletal: Normal range of motion. She exhibits no edema.  Neurological: She is alert and oriented to person, place, and time.  Skin: Skin is warm and dry. No rash noted.  Psychiatric: Judgment normal.      LAB RESULTS:  Lab  Results  Component Value Date   NA 137 11/24/2015   K 4.6 11/24/2015   CL 106 11/24/2015   CO2 26 11/24/2015   GLUCOSE 105 (H) 11/24/2015   BUN 8 11/24/2015   CREATININE 0.88 11/24/2015   CALCIUM 9.4 11/24/2015   PROT 7.5 11/24/2015   ALBUMIN 4.6 11/24/2015   AST 15 11/24/2015   ALT 10 (L) 11/24/2015   ALKPHOS 53 11/24/2015   BILITOT 0.4 11/24/2015   GFRNONAA >60 11/24/2015   GFRAA >60 11/24/2015    Lab Results  Component Value Date   WBC 8.9 12/27/2016   NEUTROABS 6.0 12/27/2016   HGB 15.3 12/27/2016   HCT 44.8 12/27/2016   MCV 99.2 12/27/2016   PLT 385 12/27/2016   Lab Results  Component Value Date   IRON 84 12/27/2016   TIBC 336 12/27/2016   IRONPCTSAT 25 12/27/2016    Lab Results  Component Value Date   FERRITIN 23 12/27/2016     STUDIES: No results found.  ASSESSMENT: Iron deficiency anemia.  PLAN:    1.  Iron deficiency anemia: Likely secondary  to malabsorption status post gastric bypass.  [facility] labs reveal ferritin of 8.  Hemoglobin 10.7.  Patient significantly symptomatic.  Please set patient up for 1 dose of IV Feraheme 510 mg.  Calculated iron deficit is 400  mg with a target HGB of 14 g/dL.   RTC in 3 months with labs and further evaluation.   Greater than 50% was spent in counseling and coordination of care with this patient including but not limited to discussion of the relevant topics above (See A&P) including, but not limited to diagnosis and management of acute and chronic medical conditions.   Patient expressed understanding and was in agreement with this plan. She also understands that She can call clinic at any time with any questions, concerns, or complaints.   Jacquelin Hawking, NP   09/20/2017 11:46 AM

## 2017-09-25 ENCOUNTER — Telehealth: Payer: Self-pay | Admitting: *Deleted

## 2017-09-25 NOTE — Telephone Encounter (Signed)
Patient reports she was to hear from our office if she was to get another infusion of iron and no one has contacted her to let her know. Please advise

## 2017-10-03 ENCOUNTER — Inpatient Hospital Stay: Payer: 59

## 2017-10-03 VITALS — BP 104/73 | HR 70 | Temp 97.3°F | Resp 18

## 2017-10-03 DIAGNOSIS — D508 Other iron deficiency anemias: Secondary | ICD-10-CM

## 2017-10-03 DIAGNOSIS — D509 Iron deficiency anemia, unspecified: Secondary | ICD-10-CM | POA: Diagnosis not present

## 2017-10-03 MED ORDER — SODIUM CHLORIDE 0.9 % IV SOLN
Freq: Once | INTRAVENOUS | Status: AC
  Administered 2017-10-03: 14:00:00 via INTRAVENOUS
  Filled 2017-10-03: qty 1000

## 2017-10-03 MED ORDER — SODIUM CHLORIDE 0.9 % IV SOLN
510.0000 mg | Freq: Once | INTRAVENOUS | Status: AC
  Administered 2017-10-03: 510 mg via INTRAVENOUS
  Filled 2017-10-03: qty 17

## 2017-11-12 ENCOUNTER — Emergency Department
Admission: EM | Admit: 2017-11-12 | Discharge: 2017-11-12 | Disposition: A | Discharge status: Home / Self Care | Payer: 59 | Attending: Student in an Organized Health Care Education/Training Program | Admitting: Student in an Organized Health Care Education/Training Program

## 2017-11-12 ENCOUNTER — Other Ambulatory Visit: Payer: Self-pay

## 2017-11-12 ENCOUNTER — Encounter: Payer: Self-pay | Admitting: Emergency Medicine

## 2017-11-12 ENCOUNTER — Emergency Department: Payer: 59

## 2017-11-12 DIAGNOSIS — R112 Nausea with vomiting, unspecified: Secondary | ICD-10-CM | POA: Insufficient documentation

## 2017-11-12 DIAGNOSIS — Z79899 Other long term (current) drug therapy: Secondary | ICD-10-CM | POA: Diagnosis not present

## 2017-11-12 DIAGNOSIS — R1013 Epigastric pain: Secondary | ICD-10-CM | POA: Diagnosis not present

## 2017-11-12 DIAGNOSIS — F17210 Nicotine dependence, cigarettes, uncomplicated: Secondary | ICD-10-CM | POA: Insufficient documentation

## 2017-11-12 LAB — COMPREHENSIVE METABOLIC PANEL
ALT: 14 U/L (ref 0–44)
AST: 21 U/L (ref 15–41)
Albumin: 4.3 g/dL (ref 3.5–5.0)
Alkaline Phosphatase: 44 U/L (ref 38–126)
Anion gap: 6 (ref 5–15)
BILIRUBIN TOTAL: 0.6 mg/dL (ref 0.3–1.2)
BUN: 8 mg/dL (ref 6–20)
CO2: 26 mmol/L (ref 22–32)
Calcium: 9.5 mg/dL (ref 8.9–10.3)
Chloride: 105 mmol/L (ref 98–111)
Creatinine, Ser: 0.68 mg/dL (ref 0.44–1.00)
GFR calc Af Amer: >60 mL/min (ref >60)
GFR calc non Af Amer: >60 mL/min (ref >60)
Glucose, Bld: 111 mg/dL — ABNORMAL HIGH (ref 70–99)
POTASSIUM: 4 mmol/L (ref 3.5–5.1)
Sodium: 137 mmol/L (ref 135–145)
TOTAL PROTEIN: 7.2 g/dL (ref 6.5–8.1)

## 2017-11-12 LAB — CBC
HEMATOCRIT: 43.9 % (ref 35.0–47.0)
Hemoglobin: 14.9 g/dL (ref 12.0–16.0)
MCH: 31.5 pg (ref 26.0–34.0)
MCHC: 33.9 g/dL (ref 32.0–36.0)
MCV: 93 fL (ref 80.0–100.0)
Platelets: 290 10*3/uL (ref 150–440)
RBC: 4.72 MIL/uL (ref 3.80–5.20)
RDW: 22.1 % — ABNORMAL HIGH (ref 11.5–14.5)
WBC: 11.1 10*3/uL — ABNORMAL HIGH (ref 3.6–11.0)

## 2017-11-12 LAB — URINALYSIS, COMPLETE (UACMP) WITH MICROSCOPIC
Bacteria, UA: NONE SEEN
Bilirubin Urine: NEGATIVE
GLUCOSE, UA: NEGATIVE mg/dL
Hgb urine dipstick: NEGATIVE
Ketones, ur: NEGATIVE mg/dL
Leukocytes, UA: NEGATIVE
Nitrite: NEGATIVE
PH: 5 (ref 5.0–8.0)
Protein, ur: NEGATIVE mg/dL
Specific Gravity, Urine: 1.003 — ABNORMAL LOW (ref 1.005–1.030)
WBC, UA: NONE SEEN WBC/hpf (ref 0–5)

## 2017-11-12 LAB — LIPASE, BLOOD: Lipase: 36 U/L (ref 11–51)

## 2017-11-12 LAB — POC URINE PREG, ED: Preg Test, Ur: NEGATIVE

## 2017-11-12 MED ORDER — SUCRALFATE 1 GM/10ML PO SUSP
1.0000 g | Freq: Three times a day (TID) | ORAL | Status: DC
  Administered 2017-11-12: 1 g via ORAL
  Filled 2017-11-12 (×2): qty 10

## 2017-11-12 MED ORDER — SODIUM CHLORIDE 0.9 % IV BOLUS
1000.0000 mL | Freq: Once | INTRAVENOUS | Status: AC
  Administered 2017-11-12: 1000 mL via INTRAVENOUS

## 2017-11-12 MED ORDER — PROMETHAZINE HCL 6.25 MG/5ML PO SYRP: 25.0000 mg | ORAL_SOLUTION | Freq: Three times a day (TID) | ORAL | 1 refills | Status: DC | PRN

## 2017-11-12 MED ORDER — PROMETHAZINE HCL 25 MG/ML IJ SOLN
12.5000 mg | Freq: Four times a day (QID) | INTRAMUSCULAR | Status: DC | PRN
  Administered 2017-11-12: 12.5 mg via INTRAVENOUS
  Filled 2017-11-12: qty 1

## 2017-11-12 NOTE — ED Notes (Signed)
Pt given gingerale for PO challenge 

## 2017-11-12 NOTE — ED Triage Notes (Signed)
Pt comes into the ED via POV c/o upper abdominal pain that started last Thursday and has continued with N/V/D.  Patient states she has also had some blood in the emesis even with her taking her carafate.  Patient attempted to call her GI specialist and get an appt with them but they sent her here.  Patient has even and unlabored respirations.  Patient states she had an EGD with dilation on the 19TY and had no complications.  Patient states that this happens pretty frequently for her. Patient in NAD at this time.

## 2017-11-12 NOTE — Discharge Instructions (Addendum)
Maintain a clear liquid diet for the next 24 hours.  Dr. Alice Reichert will be expecting you tomorrow.  Please call his clinic in the morning.  If the secretary says that there are no appointments available please inform them that you are in the ER today and that I spoke with Dr. Alice Reichert personally who said that that he would work you into clinic.  You have been seen in the emergency department for emergency care. It is important that you contact your own doctor, specialist or the closest clinic for follow-up care. Please bring this instruction sheet, all medications and X-ray copies with you when you are seen for follow-up care.  Determining the exact cause for all patients with abdominal pain is extremely difficult in the emergency department. Our primary focus is to rule-out immediate life-threatening diseases. If no immediate source of pain is found the definitive diagnosis frequently needs to be determined over time.Many times your primary care physician can determine the cause by following the symptoms over time. Sometimes, specialist are required such as Gastroenterologists, Gynecologists, Urologists or Surgeons. Please return immediately to the Emergency Department for fever>101, Vomiting or Intractable Pain. You should return to the emergency department or see your primary care provider in 12-24hrs if your pain is no better and sooner if your pain becomes worse.

## 2017-11-12 NOTE — ED Provider Notes (Signed)
[facility] Emergency Department Provider Note    First MD Initiated Contact with Patient 11/12/17 1510     (approximate)  I have reviewed the triage vital signs and the nursing notes.   HISTORY  Chief Complaint Abdominal Pain    HPI Leah Herrera is a 42 y.o. female extensive past medical history status post gastric bypass in 2010 as well as a history of requiring esophageal dilatations presents the ER with several days of hematemesis and one episode of bright red blood per rectum.  Has complaints of epigastric discomfort.   States that she was previously on Carafate but requested to be switched to Carafate solution.  Had this prescription filled yesterday has not tried that.  She is on omeprazole at home.  States she is able to tolerate water but is having frequent vomiting episodes.  Denies any fevers and chills.  No chest pain.   Past Medical History:  Diagnosis Date  . ADHD (attention deficit hyperactivity disorder)   . Anemia    chronic anemia, recieves iron infusions periodically  . Anemia   . 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 Anxiety   . Arthritis   . Cubital tunnel syndrome on right   . Depression   . OCD (obsessive compulsive disorder)   . Vitamin D deficiency    Family History  Problem Relation Age of Onset  . Lung cancer Maternal Aunt   . Lung cancer Maternal Grandmother   . Lung cancer Maternal Aunt    Past Surgical History:  Procedure Laterality Date  . BACK SURGERY    . CHOLECYSTECTOMY    . ESOPHAGOGASTRODUODENOSCOPY (EGD) WITH PROPOFOL N/A 11/27/2014   Procedure: ESOPHAGOGASTRODUODENOSCOPY (EGD) WITH PROPOFOL;  Surgeon: Hulen Luster, MD;  Location: [facility] ENDOSCOPY;  Service: Gastroenterology;  Laterality: N/A;  . ESOPHAGOGASTRODUODENOSCOPY (EGD) WITH PROPOFOL N/A 09/12/2017   Procedure: ESOPHAGOGASTRODUODENOSCOPY (EGD) WITH PROPOFOL;  Surgeon: Toledo, Benay Pike, MD;  Location: [facility]  ENDOSCOPY;  Service: Gastroenterology;  Laterality: N/A;  . gastric bipass    . GASTRIC BYPASS  2008  . ORIF TIBIA & FIBULA FRACTURES    . right leg repaired    . transposition and decompression Right elbow  . ULNAR NERVE TRANSPOSITION Right 08/28/2014   Procedure: RIGHT ULNAR NERVE DECOMPRESSION/ AND ANTERIOR TRANSPOSITION;  Surgeon: Roseanne Kaufman, MD;  Location: [place];  Service: Orthopedics;  Laterality: Right;   Patient Active Problem List   Diagnosis Date Noted  . Iron deficiency anemia 07/11/2014  . Chronic LBP 10/20/2011  . Cervical pain 10/20/2011  . Anemia, iron deficiency 03/31/2011      Prior to Admission medications   Medication Sig Start Date End Date Taking? Authorizing Provider  cyanocobalamin 1000 MCG tablet Take 100 mcg by mouth daily.    [provider]  omeprazole (PRILOSEC) 40 MG capsule Take 40 mg by mouth daily.    [provider]  ondansetron (ZOFRAN-ODT) 4 MG disintegrating tablet Take 4 mg by mouth every 8 (eight) hours as needed for nausea or vomiting.    [provider]  promethazine (PHENERGAN) 6.25 MG/5ML syrup Take 20 mLs (25 mg total) by mouth every 8 (eight) hours as needed for nausea or vomiting. 11/12/17 11/12/18  Merlyn Lot, MD  sucralfate (CARAFATE) 1 g tablet Take 1 g by mouth 4 (four) times daily -  with meals and at bedtime.    [provider]    Allergies Penicillins and Penicillins  Social History Social History   Tobacco Use  . Smoking status: Current Every Day Smoker    Packs/day: 1.00    Types: Cigarettes  . Smokeless tobacco: Never Used  Substance Use Topics  . Alcohol use: No    Comment: social  . Drug use: No    Review of Systems Patient denies headaches, rhinorrhea, blurry vision, numbness, shortness of breath, chest pain, edema, cough, abdominal pain, nausea, vomiting, diarrhea, dysuria, fevers, rashes or hallucinations unless otherwise stated above in  HPI. ____________________________________________   PHYSICAL EXAM:  VITAL SIGNS: Vitals:   11/12/17 1327  BP: 134/82  Pulse: 73  Resp: 17  Temp: 99 F (37.2 C)  SpO2: 100%    Constitutional: Alert and oriented.  Eyes: Conjunctivae are normal.  Head: Atraumatic. Nose: No congestion/rhinnorhea. Mouth/Throat: Mucous membranes are moist.   Neck: No stridor. Painless ROM.  Cardiovascular: Normal rate, regular rhythm. Grossly normal heart sounds.  Good peripheral circulation. Respiratory: Normal respiratory effort.  No retractions. Lungs CTAB. Gastrointestinal: Soft and nontender. No distention. No abdominal bruits. No CVA tenderness. Genitourinary: deferred Musculoskeletal: No lower extremity tenderness nor edema.  No joint effusions. Neurologic:  Normal speech and language. No gross focal neurologic deficits are appreciated. No facial droop Skin:  Skin is warm, dry and intact. No rash noted. Psychiatric: Mood and affect are normal. Speech and behavior are normal.  ____________________________________________   LABS (all labs ordered are listed, but only abnormal results are displayed)  Results for orders placed or performed during the hospital encounter of 11/12/17 (from the past 24 hour(s))  Lipase, blood     Status: None   Collection Time: 11/12/17  1:30 PM  Result Value Ref Range   Lipase 36 11 - 51 U/L  Comprehensive metabolic panel     Status: Abnormal   Collection Time: 11/12/17  1:30 PM  Result Value Ref Range   Sodium 137 135 - 145 mmol/L   Potassium 4.0 3.5 - 5.1 mmol/L   Chloride 105 98 - 111 mmol/L   CO2 26 22 - 32 mmol/L   Glucose, Bld 111 (H) 70 - 99 mg/dL   BUN 8 6 - 20 mg/dL   Creatinine, Ser 0.68 0.44 - 1.00 mg/dL   Calcium 9.5 8.9 - 10.3 mg/dL   Total Protein 7.2 6.5 - 8.1 g/dL   Albumin 4.3 3.5 - 5.0 g/dL   AST 21 15 - 41 U/L   ALT 14 0 - 44 U/L   Alkaline Phosphatase 44 38 - 126 U/L   Total Bilirubin 0.6 0.3 - 1.2 mg/dL   GFR calc non Af Amer  >60 >60 mL/min   GFR calc Af Amer >60 >60 mL/min   Anion gap 6 5 - 15  CBC     Status: Abnormal   Collection Time: 11/12/17  1:30 PM  Result Value Ref Range   WBC 11.1 (H) 3.6 - 11.0 K/uL   RBC 4.72 3.80 - 5.20 MIL/uL   Hemoglobin 14.9 12.0 - 16.0 g/dL   HCT 43.9 35.0 - 47.0 %   MCV 93.0 80.0 - 100.0 fL   MCH 31.5 26.0 - 34.0 pg   MCHC 33.9 32.0 - 36.0 g/dL   RDW 22.1 (H) 11.5 - 14.5 %   Platelets 290 150 - 440 K/uL  Urinalysis, Complete w Microscopic     Status: Abnormal   Collection Time: 11/12/17  1:30 PM  Result Value Ref Range   Color, Urine STRAW (A) YELLOW   APPearance CLEAR (A) CLEAR  Specific Gravity, Urine 1.003 (L) 1.005 - 1.030   pH 5.0 5.0 - 8.0   Glucose, UA NEGATIVE NEGATIVE mg/dL   Hgb urine dipstick NEGATIVE NEGATIVE   Bilirubin Urine NEGATIVE NEGATIVE   Ketones, ur NEGATIVE NEGATIVE mg/dL   Protein, ur NEGATIVE NEGATIVE mg/dL   Nitrite NEGATIVE NEGATIVE   Leukocytes, UA NEGATIVE NEGATIVE   RBC / HPF 0-5 0 - 5 RBC/hpf   WBC, UA NONE SEEN 0 - 5 WBC/hpf   Bacteria, UA NONE SEEN NONE SEEN   Squamous Epithelial / LPF 0-5 0 - 5  POC urine preg, ED     Status: None   Collection Time: 11/12/17  1:35 PM  Result Value Ref Range   Preg Test, Ur Negative Negative   ____________________________________________  EKG My review and personal interpretation at Time: 17:20   Indication: nausea  Rate: 60  Rhythm: sinus Axis: normal Other: normal intervals, no stemi ____________________________________________  RADIOLOGY  I personally reviewed all radiographic images ordered to evaluate for the above acute complaints and reviewed radiology reports and findings.  These findings were personally discussed with the patient.  Please see medical record for radiology report.  ____________________________________________   PROCEDURES  Procedure(s) performed:  Procedures    Critical Care performed: no ____________________________________________   INITIAL  IMPRESSION / ASSESSMENT AND PLAN / ED COURSE  Pertinent labs & imaging results that were available during my care of the patient were reviewed by me and considered in my medical decision making (see chart for details).   DDX: esophagitis, mediastinitis, pna, obstruction, dehydration, puchitis  Leah Herrera is a 42 y.o. who presents to the ED with symptoms as described above.  Abdominal exam is soft and benign.  She is afebrile Heema dynamically stable.  Will order oral Carafate she may be having some worsening reflux.  Possible pouchitis though less likely given her reassuring presentation and no fever.  Base with GI.  Clinical Course as of Nov 13 1730  Mon Nov 12, 2017  1608 Discussed case with Dr. Alice Reichert.  Is kindly agreed to see patient in clinic tomorrow.  Patient's x-rays are reassuring.  No evidence of obstructive pattern.  She tolerated Carafate solution.  Nausea has resolved after Phenergan.  Patient tolerated oral hydration.  At this point I do not believe that emergent CT imaging is clinically indicated.  I do believe she stable and appropriate for close outpatient follow-up.   [PR]    Clinical Course User Index [PR] Merlyn Lot, MD     As part of my medical decision making, I reviewed the following data within the [facility] notes reviewed and incorporated, Labs reviewed, notes from prior ED visits and [place] Controlled Substance Database   ____________________________________________   FINAL CLINICAL IMPRESSION(S) / ED DIAGNOSES  Final diagnoses:  Non-intractable vomiting with nausea, unspecified vomiting type      NEW MEDICATIONS STARTED DURING THIS VISIT:  New Prescriptions   PROMETHAZINE (PHENERGAN) 6.25 MG/5ML SYRUP    Take 20 mLs (25 mg total) by mouth every 8 (eight) hours as needed for nausea or vomiting.     Note:  This document was prepared using Dragon voice recognition software and may include unintentional dictation  errors.    Merlyn Lot, MD 11/12/17 [phone]

## 2017-11-13 DIAGNOSIS — R1114 Bilious vomiting: Secondary | ICD-10-CM | POA: Diagnosis not present

## 2017-11-13 DIAGNOSIS — K311 Adult hypertrophic pyloric stenosis: Secondary | ICD-10-CM | POA: Insufficient documentation

## 2017-11-13 DIAGNOSIS — Z9884 Bariatric surgery status: Secondary | ICD-10-CM | POA: Insufficient documentation

## 2017-11-13 DIAGNOSIS — K9189 Other postprocedural complications and disorders of digestive system: Secondary | ICD-10-CM | POA: Insufficient documentation

## 2017-11-16 DIAGNOSIS — K3189 Other diseases of stomach and duodenum: Secondary | ICD-10-CM | POA: Diagnosis not present

## 2017-11-16 DIAGNOSIS — R112 Nausea with vomiting, unspecified: Secondary | ICD-10-CM | POA: Diagnosis not present

## 2017-11-16 DIAGNOSIS — K222 Esophageal obstruction: Secondary | ICD-10-CM | POA: Diagnosis not present

## 2017-11-16 DIAGNOSIS — Z980 Intestinal bypass and anastomosis status: Secondary | ICD-10-CM | POA: Diagnosis not present

## 2017-11-16 DIAGNOSIS — K219 Gastro-esophageal reflux disease without esophagitis: Secondary | ICD-10-CM | POA: Diagnosis not present

## 2017-11-16 DIAGNOSIS — K2950 Unspecified chronic gastritis without bleeding: Secondary | ICD-10-CM | POA: Diagnosis not present

## 2017-12-18 DIAGNOSIS — K9189 Other postprocedural complications and disorders of digestive system: Secondary | ICD-10-CM | POA: Diagnosis not present

## 2017-12-18 DIAGNOSIS — Z713 Dietary counseling and surveillance: Secondary | ICD-10-CM | POA: Diagnosis not present

## 2017-12-18 DIAGNOSIS — D509 Iron deficiency anemia, unspecified: Secondary | ICD-10-CM | POA: Diagnosis not present

## 2017-12-18 DIAGNOSIS — Z48815 Encounter for surgical aftercare following surgery on the digestive system: Secondary | ICD-10-CM | POA: Diagnosis not present

## 2017-12-18 DIAGNOSIS — Z9884 Bariatric surgery status: Secondary | ICD-10-CM | POA: Diagnosis not present

## 2017-12-24 DIAGNOSIS — K9189 Other postprocedural complications and disorders of digestive system: Secondary | ICD-10-CM | POA: Diagnosis not present

## 2017-12-31 ENCOUNTER — Inpatient Hospital Stay: Payer: 59

## 2017-12-31 ENCOUNTER — Inpatient Hospital Stay: Payer: 59 | Admitting: Oncology

## 2018-01-14 DIAGNOSIS — K9189 Other postprocedural complications and disorders of digestive system: Secondary | ICD-10-CM | POA: Diagnosis not present

## 2018-01-14 DIAGNOSIS — Z9884 Bariatric surgery status: Secondary | ICD-10-CM | POA: Diagnosis not present

## 2018-01-14 DIAGNOSIS — Z9889 Other specified postprocedural states: Secondary | ICD-10-CM | POA: Diagnosis not present

## 2018-01-22 DIAGNOSIS — Z9884 Bariatric surgery status: Secondary | ICD-10-CM | POA: Diagnosis not present

## 2018-01-22 DIAGNOSIS — K9423 Gastrostomy malfunction: Secondary | ICD-10-CM | POA: Diagnosis not present

## 2018-01-22 DIAGNOSIS — F17210 Nicotine dependence, cigarettes, uncomplicated: Secondary | ICD-10-CM | POA: Diagnosis not present

## 2018-01-22 DIAGNOSIS — R1114 Bilious vomiting: Secondary | ICD-10-CM | POA: Diagnosis not present

## 2018-01-22 DIAGNOSIS — K913 Postprocedural intestinal obstruction, unspecified as to partial versus complete: Secondary | ICD-10-CM | POA: Diagnosis not present

## 2018-01-22 DIAGNOSIS — R0789 Other chest pain: Secondary | ICD-10-CM | POA: Diagnosis not present

## 2018-02-25 DIAGNOSIS — Z9884 Bariatric surgery status: Secondary | ICD-10-CM | POA: Diagnosis not present

## 2018-02-25 DIAGNOSIS — Z01818 Encounter for other preprocedural examination: Secondary | ICD-10-CM | POA: Diagnosis not present

## 2018-02-25 DIAGNOSIS — K9189 Other postprocedural complications and disorders of digestive system: Secondary | ICD-10-CM | POA: Diagnosis not present

## 2018-03-15 NOTE — Progress Notes (Deleted)
[place]  [phone] [phone]) [phone]  ID: Leah Herrera OB: 1975/11/28  MR#: [id number]  SAY#:[id number]  Patient Care Team: Harlan Stains, MD as PCP - General (Family Medicine) Harlan Stains, MD (Family Medicine)  CHIEF COMPLAINT:  Iron deficiency anemia  INTERVAL HISTORY: Patient returns to clinic today for repeat laboratory work and further evaluation.  She continues to have chronic weakness and fatigue.  She continues to have insomnia as well. She has no recent fevers or illnesses.  She has no neurologic complaints. She has a good appetite and denies weight loss. She denies any chest pain, shortness of breath, or cough.  She has no nausea, vomiting, constipation, or diarrhea. She denies any melena or hematochezia.  She has no urinary complaints.  Patient offers no further specific complaints today.  REVIEW OF SYSTEMS:   Review of Systems  Constitutional: Positive for malaise/fatigue. Negative for fever and weight loss.  Respiratory: Negative.  Negative for cough and shortness of breath.   Cardiovascular: Negative.  Negative for chest pain and leg swelling.  Gastrointestinal: Negative for abdominal pain, blood in stool and melena.  Genitourinary: Negative.   Musculoskeletal: Negative.   Skin: Negative.  Negative for rash.  Neurological: Positive for weakness. Negative for sensory change.  Psychiatric/Behavioral: The patient has insomnia. The patient is not nervous/anxious.     As per HPI. Otherwise, a complete review of systems is negative.  PAST MEDICAL HISTORY: Past Medical History:  Diagnosis Date  . ADHD (attention deficit hyperactivity disorder)   . Anemia    chronic anemia, recieves iron infusions periodically  . Anemia   . 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 Anxiety   . Arthritis   . Cubital tunnel syndrome on right   . Depression   . OCD (obsessive compulsive disorder)   . Vitamin D  deficiency     PAST SURGICAL HISTORY: Past Surgical History:  Procedure Laterality Date  . BACK SURGERY    . CHOLECYSTECTOMY    . ESOPHAGOGASTRODUODENOSCOPY (EGD) WITH PROPOFOL N/A 11/27/2014   Procedure: ESOPHAGOGASTRODUODENOSCOPY (EGD) WITH PROPOFOL;  Surgeon: Hulen Luster, MD;  Location[facility] ENDOSCOPY;  Service: Gastroenterology;  Laterality: N/A;  . ESOPHAGOGASTRODUODENOSCOPY (EGD) WITH PROPOFOL N/A 09/12/2017   Procedure: ESOPHAGOGASTRODUODENOSCOPY (EGD) WITH PROPOFOL;  Surgeon: Toledo, Benay Pike, MD;  Location: [facility] ENDOSCOPY;  Service: Gastroenterology;  Laterality: N/A;  . gastric bipass    . GASTRIC BYPASS  2008  . ORIF TIBIA & FIBULA FRACTURES    . right leg repaired    . transposition and decompression Right elbow  . ULNAR NERVE TRANSPOSITION Right 08/28/2014   Procedure: RIGHT ULNAR NERVE DECOMPRESSION/ AND ANTERIOR TRANSPOSITION;  Surgeon: Roseanne Kaufman, MD;  Location: [place];  Service: Orthopedics;  Laterality: Right;    FAMILY HISTORY: Reviewed and unchanged. No reported history of malignancy or chronic disease.     ADVANCED DIRECTIVES:    HEALTH MAINTENANCE: Social History   Tobacco Use  . Smoking status: Current Every Day Smoker    Packs/day: 1.00    Types: Cigarettes  . Smokeless tobacco: Never Used  Substance Use Topics  . Alcohol use: No    Comment: social  . Drug use: No     Allergies  Allergen Reactions  . Penicillins Anaphylaxis  . Penicillins     Current Outpatient Medications  Medication Sig Dispense Refill  . cyanocobalamin 1000 MCG tablet Take 100 mcg by mouth daily.    Marland Kitchen omeprazole (PRILOSEC) 40 MG  capsule Take 40 mg by mouth daily.    . ondansetron (ZOFRAN-ODT) 4 MG disintegrating tablet Take 4 mg by mouth every 8 (eight) hours as needed for nausea or vomiting.    . promethazine (PHENERGAN) 6.25 MG/5ML syrup Take 20 mLs (25 mg total) by mouth every 8 (eight) hours as needed for nausea or vomiting. 120 mL 1  . sucralfate  (CARAFATE) 1 g tablet Take 1 g by mouth 4 (four) times daily -  with meals and at bedtime.     No current facility-administered medications for this visit.    Facility-Administered Medications Ordered in Other Visits  Medication Dose Route Frequency Provider Last Rate Last Dose  . 0.9 %  sodium chloride infusion   Intravenous Continuous Lloyd Huger, MD   Stopped at 09/20/17 1315    OBJECTIVE: There were no vitals filed for this visit.   There is no height or weight on file to calculate BMI.    ECOG FS:0 - Asymptomatic  General: Well-developed, well-nourished, no acute distress. Eyes: Pink conjunctiva, anicteric sclera. Lungs: Clear to auscultation bilaterally. Heart: Regular rate and rhythm. No rubs, murmurs, or gallops. Abdomen: Soft, nontender, nondistended. No organomegaly noted, normoactive bowel sounds. Musculoskeletal: No edema, cyanosis, or clubbing. Neuro: Alert, answering all questions appropriately. Cranial nerves grossly intact. Skin: No rashes or petechiae noted. Psych: Normal affect.   LAB RESULTS:  Lab Results  Component Value Date   NA 137 11/12/2017   K 4.0 11/12/2017   CL 105 11/12/2017   CO2 26 11/12/2017   GLUCOSE 111 (H) 11/12/2017   BUN 8 11/12/2017   CREATININE 0.68 11/12/2017   CALCIUM 9.5 11/12/2017   PROT 7.2 11/12/2017   ALBUMIN 4.3 11/12/2017   AST 21 11/12/2017   ALT 14 11/12/2017   ALKPHOS 44 11/12/2017   BILITOT 0.6 11/12/2017   GFRNONAA >60 11/12/2017   GFRAA >60 11/12/2017    Lab Results  Component Value Date   WBC 11.1 (H) 11/12/2017   NEUTROABS 6.0 12/27/2016   HGB 14.9 11/12/2017   HCT 43.9 11/12/2017   MCV 93.0 11/12/2017   PLT 290 11/12/2017   Lab Results  Component Value Date   IRON 84 12/27/2016   TIBC 336 12/27/2016   IRONPCTSAT 25 12/27/2016    Lab Results  Component Value Date   FERRITIN 23 12/27/2016     STUDIES: No results found.  ASSESSMENT: Iron deficiency anemia.  PLAN:    1.  Iron  deficiency anemia: Likely secondary to malabsorption status post gastric bypass.  Patient's hemoglobin and iron stores are now well within normal limits.  She does not require an infusion today.  She last received IV Feraheme on October 05, 2016.  Return to clinic in 6 months with repeat laboratory work and further evaluation.  2. Insomnia: Likely secondary to switching to third shift, monitor. 3.  B12 deficiency: Patient's B12 levels are now within normal limits.  Approximately 20 minutes was spent in discussion of which greater than 50% was consultation.  Patient expressed understanding and was in agreement with this plan. She also understands that She can call clinic at any time with any questions, concerns, or complaints.    Lloyd Huger, MD   03/15/2018 7:02 PM

## 2018-03-18 ENCOUNTER — Inpatient Hospital Stay: Payer: 59

## 2018-03-18 ENCOUNTER — Inpatient Hospital Stay: Payer: 59 | Admitting: Oncology

## 2018-04-05 DIAGNOSIS — K9189 Other postprocedural complications and disorders of digestive system: Secondary | ICD-10-CM | POA: Diagnosis not present

## 2018-04-05 DIAGNOSIS — K913 Postprocedural intestinal obstruction, unspecified as to partial versus complete: Secondary | ICD-10-CM | POA: Diagnosis not present

## 2018-04-05 DIAGNOSIS — K311 Adult hypertrophic pyloric stenosis: Secondary | ICD-10-CM | POA: Diagnosis not present

## 2018-04-05 DIAGNOSIS — Z9884 Bariatric surgery status: Secondary | ICD-10-CM | POA: Diagnosis not present

## 2018-04-23 DIAGNOSIS — Z713 Dietary counseling and surveillance: Secondary | ICD-10-CM | POA: Diagnosis not present

## 2018-04-23 DIAGNOSIS — K913 Postprocedural intestinal obstruction, unspecified as to partial versus complete: Secondary | ICD-10-CM | POA: Diagnosis not present

## 2019-01-11 ENCOUNTER — Encounter: Payer: Self-pay | Admitting: Emergency Medicine

## 2019-01-11 ENCOUNTER — Emergency Department
Admission: EM | Admit: 2019-01-11 | Discharge: 2019-01-11 | Disposition: A | Discharge status: Home / Self Care | Payer: 59 | Attending: Emergency Medicine | Admitting: Emergency Medicine

## 2019-01-11 ENCOUNTER — Other Ambulatory Visit: Payer: Self-pay

## 2019-01-11 DIAGNOSIS — F17210 Nicotine dependence, cigarettes, uncomplicated: Secondary | ICD-10-CM | POA: Insufficient documentation

## 2019-01-11 DIAGNOSIS — R001 Bradycardia, unspecified: Secondary | ICD-10-CM | POA: Diagnosis not present

## 2019-01-11 DIAGNOSIS — R42 Dizziness and giddiness: Secondary | ICD-10-CM | POA: Diagnosis not present

## 2019-01-11 DIAGNOSIS — Z79899 Other long term (current) drug therapy: Secondary | ICD-10-CM | POA: Insufficient documentation

## 2019-01-11 LAB — URINALYSIS, COMPLETE (UACMP) WITH MICROSCOPIC
Bilirubin Urine: NEGATIVE
Glucose, UA: NEGATIVE mg/dL
Hgb urine dipstick: NEGATIVE
Ketones, ur: NEGATIVE mg/dL
Leukocytes,Ua: NEGATIVE
Nitrite: NEGATIVE
Protein, ur: NEGATIVE mg/dL
Specific Gravity, Urine: 1.009 (ref 1.005–1.030)
pH: 6 (ref 5.0–8.0)

## 2019-01-11 LAB — CBC
HCT: 32.3 % — ABNORMAL LOW (ref 36.0–46.0)
Hemoglobin: 9.4 g/dL — ABNORMAL LOW (ref 12.0–15.0)
MCH: 22 pg — ABNORMAL LOW (ref 26.0–34.0)
MCHC: 29.1 g/dL — ABNORMAL LOW (ref 30.0–36.0)
MCV: 75.5 fL — ABNORMAL LOW (ref 80.0–100.0)
Platelets: 398 10*3/uL (ref 150–400)
RBC: 4.28 MIL/uL (ref 3.87–5.11)
RDW: 19.4 % — ABNORMAL HIGH (ref 11.5–15.5)
WBC: 14.8 10*3/uL — ABNORMAL HIGH (ref 4.0–10.5)
nRBC: 0 % (ref 0.0–0.2)

## 2019-01-11 LAB — TROPONIN I (HIGH SENSITIVITY): Troponin I (High Sensitivity): 3 ng/L (ref <18)

## 2019-01-11 LAB — BASIC METABOLIC PANEL
Anion gap: 11 (ref 5–15)
BUN: 15 mg/dL (ref 6–20)
CO2: 21 mmol/L — ABNORMAL LOW (ref 22–32)
Calcium: 9.2 mg/dL (ref 8.9–10.3)
Chloride: 104 mmol/L (ref 98–111)
Creatinine, Ser: 1.09 mg/dL — ABNORMAL HIGH (ref 0.44–1.00)
GFR calc Af Amer: >60 mL/min (ref >60)
GFR calc non Af Amer: >60 mL/min (ref >60)
Glucose, Bld: 106 mg/dL — ABNORMAL HIGH (ref 70–99)
Potassium: 4.4 mmol/L (ref 3.5–5.1)
Sodium: 136 mmol/L (ref 135–145)

## 2019-01-11 LAB — TSH: TSH: 2.304 u[IU]/mL (ref 0.350–4.500)

## 2019-01-11 LAB — POCT PREGNANCY, URINE: Preg Test, Ur: NEGATIVE

## 2019-01-11 MED ORDER — SODIUM CHLORIDE 0.9 % IV BOLUS
1000.0000 mL | Freq: Once | INTRAVENOUS | Status: AC
  Administered 2019-01-11: 17:00:00 1000 mL via INTRAVENOUS

## 2019-01-11 MED ORDER — SODIUM CHLORIDE 0.9% FLUSH: 3.0000 mL | Freq: Once | INTRAVENOUS | Status: DC

## 2019-01-11 NOTE — ED Notes (Signed)
Pt states she is ready to go home. EDP made aware.

## 2019-01-11 NOTE — ED Triage Notes (Signed)
Pt arrived via POV with reports of feeling dizzy when she woke up at 8am, pt also states she had heart rate in 40s low was 48 when she checked it manually.  Pt denies taking any medications for blood pressure or heart.  Pt reports dizziness, pt states she feels like she is going to pass out. Pt reports her head feels like a heaviness and that the back of her arms and back feel tingling that started today.    Pt states she went to bed around 12:15am and did not get up during the night.

## 2019-01-11 NOTE — ED Notes (Signed)
Attempted blood draw unsuccessful, x1, Scott EDT also tried x1 unsuccessful. First RN Brandy to try to draw labs at this time.

## 2019-01-11 NOTE — ED Notes (Signed)
This nurse introduced self to pt, pt states that around 0800 this morning she began to feel dizzy, denies nausea or vomiting. States that she has never had this feeling before. While putting on BP cuff, this nurse could physically feel the pt shaking, pt stated that it started when she began to feel dizzy.

## 2019-01-11 NOTE — ED Notes (Signed)
PT unable to void at this time. 

## 2019-01-11 NOTE — ED Notes (Signed)
Pt came to the nurses station stating that she is ready to go home. MD made aware that she is ready to leave and he stated that he would go in to speak with her.

## 2019-01-11 NOTE — ED Provider Notes (Signed)
[facility] Emergency Department Provider Note  ____________________________________________   I have reviewed the triage vital signs and the nursing notes.   HISTORY  Chief Complaint Dizziness   History limited by: Not Limited   HPI NAJELY GILLINGS is a 43 y.o. female who presents to the emergency department today because of concern for lightheadedness and low heart rate.  Patient states that the symptoms started after waking today.  She noticed that she became very lightheaded.  She felt generalized weakness with this.  She checked her pulse and noticed that she was bradycardic.  She states normally her heart rate is in the 70s according to her Fitbit.  Today however was dropping into the high 40s.  The patient denies any unusual activity yesterday.  Denies any new medications or change in doses of medication.  Denies any recent illness.  Records reviewed. Per medical record review patient has a history of depression, anemia.   Past Medical History:  Diagnosis Date  . ADHD (attention deficit hyperactivity disorder)   . Anemia    chronic anemia, recieves iron infusions periodically  . Anemia   . 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 Anxiety   . Arthritis   . Cubital tunnel syndrome on right   . Depression   . OCD (obsessive compulsive disorder)   . Vitamin D deficiency     Patient Active Problem List   Diagnosis Date Noted  . Iron deficiency anemia 07/11/2014  . Chronic LBP 10/20/2011  . Cervical pain 10/20/2011  . Anemia, iron deficiency 03/31/2011    Past Surgical History:  Procedure Laterality Date  . BACK SURGERY    . CHOLECYSTECTOMY    . ESOPHAGOGASTRODUODENOSCOPY (EGD) WITH PROPOFOL N/A 11/27/2014   Procedure: ESOPHAGOGASTRODUODENOSCOPY (EGD) WITH PROPOFOL;  Surgeon: Hulen Luster, MD;  Location: [facility] ENDOSCOPY;  Service: Gastroenterology;  Laterality: N/A;  . ESOPHAGOGASTRODUODENOSCOPY (EGD) WITH  PROPOFOL N/A 09/12/2017   Procedure: ESOPHAGOGASTRODUODENOSCOPY (EGD) WITH PROPOFOL;  Surgeon: Toledo, Benay Pike, MD;  Location: [facility] ENDOSCOPY;  Service: Gastroenterology;  Laterality: N/A;  . gastric bipass    . GASTRIC BYPASS  2008  . ORIF TIBIA & FIBULA FRACTURES    . right leg repaired    . transposition and decompression Right elbow  . ULNAR NERVE TRANSPOSITION Right 08/28/2014   Procedure: RIGHT ULNAR NERVE DECOMPRESSION/ AND ANTERIOR TRANSPOSITION;  Surgeon: Roseanne Kaufman, MD;  Location: [place];  Service: Orthopedics;  Laterality: Right;    Prior to Admission medications   Medication Sig Start Date End Date Taking? Authorizing Provider  cyanocobalamin 1000 MCG tablet Take 100 mcg by mouth daily.    [provider]  omeprazole (PRILOSEC) 40 MG capsule Take 40 mg by mouth daily.    [provider]  ondansetron (ZOFRAN-ODT) 4 MG disintegrating tablet Take 4 mg by mouth every 8 (eight) hours as needed for nausea or vomiting.    [provider]  promethazine (PHENERGAN) 6.25 MG/5ML syrup Take 20 mLs (25 mg total) by mouth every 8 (eight) hours as needed for nausea or vomiting. 11/12/17 11/12/18  Merlyn Lot, MD  sucralfate (CARAFATE) 1 g tablet Take 1 g by mouth 4 (four) times daily -  with meals and at bedtime.    [provider]    Allergies Penicillins and Penicillins  Family History  Problem Relation Age of Onset  . Lung cancer Maternal Aunt   . Lung cancer Maternal Grandmother   . Lung cancer Maternal Aunt  Social History Social History   Tobacco Use  . Smoking status: Current Every Day Smoker    Packs/day: 1.00    Types: Cigarettes  . Smokeless tobacco: Never Used  Substance Use Topics  . Alcohol use: No    Comment: social  . Drug use: No    Review of Systems Constitutional: No fever/chills Eyes: No visual changes. ENT: No sore throat. Cardiovascular: Positive for slow heart rate.  Respiratory: Denies  shortness of breath. Gastrointestinal: No abdominal pain.  No nausea, no vomiting.  No diarrhea.   Genitourinary: Negative for dysuria. Musculoskeletal: Negative for back pain. Skin: Negative for rash. Neurological: Positive for lightheadedness.  ____________________________________________   PHYSICAL EXAM:  VITAL SIGNS: ED Triage Vitals  Enc Vitals Group     BP 01/11/19 1229 123/71     Pulse Rate 01/11/19 1229 (!) 49     Resp 01/11/19 1229 16     Temp 01/11/19 1229 98.6 F (37 C)     Temp Source 01/11/19 1229 Oral     SpO2 01/11/19 1229 100 %     Weight 01/11/19 1229 166 lb (75.3 kg)     Height 01/11/19 1229 5\' 4"  (1.626 m)     Head Circumference --      Peak Flow --      Pain Score 01/11/19 1240 0   Constitutional: Alert and oriented.  Eyes: Conjunctivae are normal.  ENT      Head: Normocephalic and atraumatic.      Nose: No congestion/rhinnorhea.      Mouth/Throat: Mucous membranes are moist.      Neck: No stridor. Hematological/Lymphatic/Immunilogical: No cervical lymphadenopathy. Cardiovascular: Bradycardia, regular rhythm.  No murmurs, rubs, or gallops.  Respiratory: Normal respiratory effort without tachypnea nor retractions. Breath sounds are clear and equal bilaterally. No wheezes/rales/rhonchi. Gastrointestinal: Soft and non tender. No rebound. No guarding.  Genitourinary: Deferred Musculoskeletal: Normal range of motion in all extremities. No lower extremity edema. Neurologic:  Normal speech and language. No gross focal neurologic deficits are appreciated.  Skin:  Skin is warm, dry and intact. No rash noted. Psychiatric: Mood and affect are normal. Speech and behavior are normal. Patient exhibits appropriate insight and judgment.  ____________________________________________    LABS (pertinent positives/negatives)  CBC wbc 14.8, hgb 9.4, plt 398 BMP wnl except co2 21, glu 106, cr 1.09 Upreg neg UA hazy, few  bacteria  ____________________________________________   EKG  I, Nance Pear, attending physician, personally viewed and interpreted this EKG  EKG Time: 1232 Rate: 50 Rhythm: sinus bradycardia Axis: rightward axis Intervals: qtc 426 QRS: narrow ST changes: no st elevation Impression: abnormal ekg  ____________________________________________    RADIOLOGY  None  ____________________________________________   PROCEDURES  Procedures  ____________________________________________   INITIAL IMPRESSION / ASSESSMENT AND PLAN / ED COURSE  Pertinent labs & imaging results that were available during my care of the patient were reviewed by me and considered in my medical decision making (see chart for details).   Patient presented to the emergency department today because of concern for lightheadedness. The patient states symptoms started today. Blood work does show some anemia. Patient states she has history of anemia. Cannot take oral iron, gets transfusions. Doubt significant acute loss given lack of tachycardia or hypotension. She was noted to be bradycardic. Per chart review she has had heart rates in the 50s before. Unclear what would cause her heart rate to be lower today. Blood work without elevated troponin, TSH wnl. Patient was given IVFs. No significant improvement  for the patient, however after IV fluids she did request discharge. Discussed other possibilities with patient including posterior stroke, however I think this is less likely. Discussed follow up with patient. Will give cardiology and neurology information.  ____________________________________________   FINAL CLINICAL IMPRESSION(S) / ED DIAGNOSES  Final diagnoses:  Lightheadedness  Bradycardia     Note: This dictation was prepared with Dragon dictation. Any transcriptional errors that result from this process are unintentional     Nance Pear, MD 01/11/19 1839

## 2019-01-11 NOTE — Discharge Instructions (Signed)
Please seek medical attention for any high fevers, chest pain, shortness of breath, change in behavior, persistent vomiting, bloody stool or any other new or concerning symptoms.  

## 2019-02-04 ENCOUNTER — Other Ambulatory Visit (HOSPITAL_COMMUNITY)
Admission: AD | Admit: 2019-02-04 | Discharge: 2019-02-04 | Disposition: A | Discharge status: Home / Self Care | Payer: 59 | Source: Other Acute Inpatient Hospital | Attending: Oncology | Admitting: Oncology

## 2019-02-04 DIAGNOSIS — D509 Iron deficiency anemia, unspecified: Secondary | ICD-10-CM | POA: Insufficient documentation

## 2019-02-04 LAB — CBC WITH DIFFERENTIAL/PLATELET
Abs Immature Granulocytes: 0.02 10*3/uL (ref 0.00–0.07)
Basophils Absolute: 0.1 10*3/uL (ref 0.0–0.1)
Basophils Relative: 1 %
Eosinophils Absolute: 0.3 10*3/uL (ref 0.0–0.5)
Eosinophils Relative: 4 %
HCT: 31.4 % — ABNORMAL LOW (ref 36.0–46.0)
Hemoglobin: 8.8 g/dL — ABNORMAL LOW (ref 12.0–15.0)
Immature Granulocytes: 0 %
Lymphocytes Relative: 23 %
Lymphs Abs: 2 10*3/uL (ref 0.7–4.0)
MCH: 21.7 pg — ABNORMAL LOW (ref 26.0–34.0)
MCHC: 28 g/dL — ABNORMAL LOW (ref 30.0–36.0)
MCV: 77.3 fL — ABNORMAL LOW (ref 80.0–100.0)
Monocytes Absolute: 0.9 10*3/uL (ref 0.1–1.0)
Monocytes Relative: 10 %
Neutro Abs: 5.5 10*3/uL (ref 1.7–7.7)
Neutrophils Relative %: 62 %
Platelets: 352 10*3/uL (ref 150–400)
RBC: 4.06 MIL/uL (ref 3.87–5.11)
RDW: 18.9 % — ABNORMAL HIGH (ref 11.5–15.5)
WBC: 8.7 10*3/uL (ref 4.0–10.5)
nRBC: 0 % (ref 0.0–0.2)

## 2019-02-04 LAB — FERRITIN: Ferritin: 4 ng/mL — ABNORMAL LOW (ref 11–307)

## 2019-02-04 LAB — IRON AND TIBC
Iron: 10 ug/dL — ABNORMAL LOW (ref 28–170)
Saturation Ratios: 2 % — ABNORMAL LOW (ref 10.4–31.8)
TIBC: 518 ug/dL — ABNORMAL HIGH (ref 250–450)
UIBC: 508 ug/dL

## 2019-02-05 ENCOUNTER — Telehealth: Payer: Self-pay | Admitting: *Deleted

## 2019-02-05 ENCOUNTER — Other Ambulatory Visit: Payer: Self-pay | Admitting: Oncology

## 2019-02-05 NOTE — Telephone Encounter (Signed)
Patient called reporting that she had labs drawn yesterday and that she is symptomatically anemic. She has an appointment on 12/7, but is asking if she can come in ASAP for iron infusion.Please advise. Ferritin Order: [id number] Status:  Final result Visible to patient:  No (not released) Next appt:  02/10/2019 at 02:30 PM in Oncology Lloyd Huger, MD)  Ref Range & Units 1d ago (02/04/19) 5yr ago (12/27/16) 43yr ago (09/25/16)  Ferritin 11 - 307 ng/mL 4Low   23  8Low    Comment: Performed at [facility], [address], [place], [place] [address]  Resulting Agency  [facility] CLIN LAB [facility] CLIN LAB [facility] CLIN LAB      Specimen Collected: 02/04/19 16:10 Last Resulted: 02/04/19 18:26     Lab Flowsheet   Order Details   View Encounter   Lab and Collection Details   Routing   Result History         Other Results from 02/04/2019  Contains abnormal data CBC with Differential/Platelet Order: [id number]  Status:  Final result Visible to patient:  No (not released) Next appt:  02/10/2019 at 02:30 PM in Oncology Lloyd Huger, MD)  Ref Range & Units 1d ago 3wk ago 38yr ago  WBC 4.0 - 10.5 K/uL 8.7  14.8High   11.1High  R   RBC 3.87 - 5.11 MIL/uL 4.06  4.28  4.72 R   Hemoglobin 12.0 - 15.0 g/dL 8.8Low   9.4Low   14.9 R   Comment: Reticulocyte Hemoglobin testing  may be clinically indicated,  consider ordering this additional  test [id number]   HCT 36.0 - 46.0 % 31.4Low   32.3Low   43.9 R   MCV 80.0 - 100.0 fL 77.3Low   75.5Low   93.0   MCH 26.0 - 34.0 pg 21.7Low   22.0Low   31.5   MCHC 30.0 - 36.0 g/dL 28.0Low   29.1Low   33.9 R   RDW 11.5 - 15.5 % 18.9High   19.4High   22.1High  R   Platelets 150 - 400 K/uL 352  398  290 R, CM   nRBC 0.0 - 0.2 % 0.0  0.0 CM    Neutrophils Relative % % 62     Neutro Abs 1.7 - 7.7 K/uL 5.5     Lymphocytes Relative % 23     Lymphs Abs 0.7 - 4.0 K/uL 2.0     Monocytes Relative % 10     Monocytes Absolute 0.1 - 1.0 K/uL 0.9     Eosinophils  Relative % 4     Eosinophils Absolute 0.0 - 0.5 K/uL 0.3     Basophils Relative % 1     Basophils Absolute 0.0 - 0.1 K/uL 0.1     Immature Granulocytes % 0     Abs Immature Granulocytes 0.00 - 0.07 K/uL 0.02     Comment: Performed at [facility], [address], [place],  [address]  Resulting Agency  [facility] CLIN LAB [facility] CLIN LAB [facility] CLIN LAB      Specimen Collected: 02/04/19 16:20 Last Resulted: 02/04/19 16:25     Lab Flowsheet   Order Details   View Encounter   Lab and Collection Details   Routing   Result History     CM=Additional commentsR=Reference range differs from displayed range        Contains abnormal data Iron and TIBC Order: [id number]  Status:  Final result Visible to patient:  No (not released) Next appt:  02/10/2019 at  02:30 PM in Oncology Lloyd Huger, MD)  Ref Range & Units 1d ago (02/04/19) 40yr ago (12/27/16) 55yr ago (09/25/16)  Iron 28 - 170 ug/dL 10Low   84  40   TIBC 250 - 450 ug/dL 518High   336  401   Saturation Ratios 10.4 - 31.8 % 2Low   25  10Low    UIBC ug/dL 508  252  361   Comment: Performed at [facility], [address] [place], [place] [address]  Resulting Agency  [facility] CLIN LAB [facility] CLIN LAB [facility] CLIN LAB      Specimen Collected: 02/04/19 16:10 Last Resulted: 02/04/19 18:26

## 2019-02-05 NOTE — Telephone Encounter (Signed)
Patient informed that we do not have chair available this week, She is only scheduled for physician visit on Monday and not an infusion. Please advise scheduling if you want her scheduled for iron infusion too as her Ferritin is only 4

## 2019-02-05 NOTE — Telephone Encounter (Signed)
12/7 in fine with lab. MD, feraheme.

## 2019-02-05 NOTE — Telephone Encounter (Signed)
Pt has been scheduled for lab, Leah Herrera, Infusion on 12/7. Called pt to give appt details with no answer. Will attempt to contact pt tomorrow.

## 2019-02-05 NOTE — Telephone Encounter (Signed)
Pt will need venofer per insurance.

## 2019-02-05 NOTE — Telephone Encounter (Signed)
We don't have any chairs available this week for add-ons.

## 2019-02-09 ENCOUNTER — Other Ambulatory Visit: Payer: Self-pay

## 2019-02-09 DIAGNOSIS — D509 Iron deficiency anemia, unspecified: Secondary | ICD-10-CM

## 2019-02-09 NOTE — Progress Notes (Signed)
[place]  Telephone:(336) [phone] Fax:(336) [phone]  ID: Leah Herrera OB: [birth year]  MR#: [id number]  CSN#:[id number]  Patient Care Team: Harlan Stains, MD as PCP - General (Family Medicine) Harlan Stains, MD (Family Medicine)  CHIEF COMPLAINT:  Iron deficiency anemia  INTERVAL HISTORY: Patient was last evaluated in clinic in October 2018.  She is referred back after having found to have significantly reduced iron stores and hemoglobin.  She also has increased weakness and fatigue.  She has no neurologic complaints.  She denies any recent fevers or illnesses.  She has a good appetite and denies weight loss.  She denies any chest pain, shortness of breath, cough, or hemoptysis.  She has no nausea, vomiting, constipation, or diarrhea. She denies any melena or hematochezia.  She has no urinary complaints.  Patient offers no further specific complaints today.  REVIEW OF SYSTEMS:   Review of Systems  Constitutional: Positive for malaise/fatigue. Negative for fever and weight loss.  Respiratory: Negative.  Negative for cough and shortness of breath.   Cardiovascular: Negative.  Negative for chest pain and leg swelling.  Gastrointestinal: Negative for abdominal pain, blood in stool and melena.  Genitourinary: Negative.  Negative for dysuria.  Musculoskeletal: Negative.  Negative for back pain.  Skin: Negative.  Negative for rash.  Neurological: Positive for weakness. Negative for sensory change.  Psychiatric/Behavioral: Negative.  The patient is not nervous/anxious and does not have insomnia.     As per HPI. Otherwise, a complete review of systems is negative.  PAST MEDICAL HISTORY: Past Medical History:  Diagnosis Date  . ADHD (attention deficit hyperactivity disorder)   . Anemia    chronic anemia, recieves iron infusions periodically  . Anemia   . 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 Anxiety   . Arthritis   .  Cubital tunnel syndrome on right   . Depression   . OCD (obsessive compulsive disorder)   . Vitamin D deficiency     PAST SURGICAL HISTORY: Past Surgical History:  Procedure Laterality Date  . BACK SURGERY    . CHOLECYSTECTOMY    . ESOPHAGOGASTRODUODENOSCOPY (EGD) WITH PROPOFOL N/A 11/27/2014   Procedure: ESOPHAGOGASTRODUODENOSCOPY (EGD) WITH PROPOFOL;  Surgeon: Hulen Luster, MD;  Location: [facility] ENDOSCOPY;  Service: Gastroenterology;  Laterality: N/A;  . ESOPHAGOGASTRODUODENOSCOPY (EGD) WITH PROPOFOL N/A 09/12/2017   Procedure: ESOPHAGOGASTRODUODENOSCOPY (EGD) WITH PROPOFOL;  Surgeon: Toledo, Benay Pike, MD;  Location: [facility] ENDOSCOPY;  Service: Gastroenterology;  Laterality: N/A;  . gastric bipass    . GASTRIC BYPASS  2008  . ORIF TIBIA & FIBULA FRACTURES    . right leg repaired    . transposition and decompression Right elbow  . ULNAR NERVE TRANSPOSITION Right 08/28/2014   Procedure: RIGHT ULNAR NERVE DECOMPRESSION/ AND ANTERIOR TRANSPOSITION;  Surgeon: Roseanne Kaufman, MD;  Location: [place];  Service: Orthopedics;  Laterality: Right;    FAMILY HISTORY: Reviewed and unchanged. No reported history of malignancy or chronic disease.     ADVANCED DIRECTIVES:    HEALTH MAINTENANCE: Social History   Tobacco Use  . Smoking status: Current Every Day Smoker    Packs/day: 1.00    Types: Cigarettes  . Smokeless tobacco: Never Used  Substance Use Topics  . Alcohol use: No    Comment: social  . Drug use: No     Allergies  Allergen Reactions  . Penicillins Hives and Rash  . Penicillins Anaphylaxis    Current Outpatient Medications  Medication Sig Dispense Refill  .  buPROPion (WELLBUTRIN SR) 150 MG 12 hr tablet Take 150 mg by mouth daily.    . Eszopiclone 3 MG TABS Take 3 mg by mouth at bedtime as needed.    Marland Kitchen lisdexamfetamine (VYVANSE) 30 MG capsule Take 30 mg by mouth 2 (two) times daily.    . ondansetron (ZOFRAN-ODT) 4 MG disintegrating tablet Take 4 mg by  mouth every 8 (eight) hours as needed for nausea or vomiting.    . propranolol (INDERAL) 10 MG tablet Take 10 mg by mouth 3 (three) times daily as needed.    . sertraline (ZOLOFT) 100 MG tablet Take 100 mg by mouth at bedtime.    . cloNIDine (CATAPRES) 0.1 MG tablet Take 0.1 mg by mouth daily as needed.     No current facility-administered medications for this visit.    Facility-Administered Medications Ordered in Other Visits  Medication Dose Route Frequency Provider Last Rate Last Dose  . 0.9 %  sodium chloride infusion   Intravenous Continuous Lloyd Huger, MD   Stopped at 09/20/17 1315    OBJECTIVE: Vitals:   02/10/19 1446  BP: (!) 163/93  Pulse: 76  Resp: 16  Temp: 98.6 F (37 C)  SpO2: 100%     Body mass index is 28.36 kg/m.    ECOG FS:0 - Asymptomatic  General: Well-developed, well-nourished, no acute distress. Eyes: Pink conjunctiva, anicteric sclera. HEENT: Normocephalic, moist mucous membranes. Lungs: Clear to auscultation bilaterally. Heart: Regular rate and rhythm. No rubs, murmurs, or gallops. Abdomen: Soft, nontender, nondistended. No organomegaly noted, normoactive bowel sounds. Musculoskeletal: No edema, cyanosis, or clubbing. Neuro: Alert, answering all questions appropriately. Cranial nerves grossly intact. Skin: No rashes or petechiae noted. Psych: Normal affect.  LAB RESULTS:  Lab Results  Component Value Date   NA 136 01/11/2019   K 4.4 01/11/2019   CL 104 01/11/2019   CO2 21 (L) 01/11/2019   GLUCOSE 106 (H) 01/11/2019   BUN 15 01/11/2019   CREATININE 1.09 (H) 01/11/2019   CALCIUM 9.2 01/11/2019   PROT 7.2 11/12/2017   ALBUMIN 4.3 11/12/2017   AST 21 11/12/2017   ALT 14 11/12/2017   ALKPHOS 44 11/12/2017   BILITOT 0.6 11/12/2017   GFRNONAA >60 01/11/2019   GFRAA >60 01/11/2019    Lab Results  Component Value Date   WBC 8.7 02/04/2019   NEUTROABS 5.5 02/04/2019   HGB 8.8 (L) 02/04/2019   HCT 31.4 (L) 02/04/2019   MCV 77.3 (L)  02/04/2019   PLT 352 02/04/2019   Lab Results  Component Value Date   IRON 10 (L) 02/04/2019   TIBC 518 (H) 02/04/2019   IRONPCTSAT 2 (L) 02/04/2019    Lab Results  Component Value Date   FERRITIN 4 (L) 02/04/2019     STUDIES: No results found.  ASSESSMENT: Iron deficiency anemia.  PLAN:    1.  Iron deficiency anemia: Likely secondary to malabsorption status post gastric bypass.  Patient's hemoglobin has trended down to 8.8 and she also has significantly reduced iron stores.  Proceed with 200 mg IV Venofer today.  Patient return to clinic in 1, 2, and 3 weeks for additional treatment.  She would then return to clinic in 3 months with repeat laboratory work and video assisted telemedicine visit to discuss her results.  Patient lives in the [place] area and have recommended a transfer of care to [facility] [employer] [employer] [facility] where she works.  Patient does not wish to transfer at this time, but will consider it in the future.  2. Insomnia: Patient does not complain of this today. 3.  B12 deficiency: Patient's B12 levels are now within normal limits.  I spent a total of 30 minutes face-to-face with the patient of which greater than 50% of the visit was spent in counseling and coordination of care as detailed above.   Patient expressed understanding and was in agreement with this plan. She also understands that She can call clinic at any time with any questions, concerns, or complaints.    Lloyd Huger, MD   02/11/2019 6:55 AM

## 2019-02-10 ENCOUNTER — Inpatient Hospital Stay: Payer: 59

## 2019-02-10 ENCOUNTER — Encounter: Payer: Self-pay | Admitting: Oncology

## 2019-02-10 ENCOUNTER — Inpatient Hospital Stay: Payer: 59 | Attending: Oncology | Admitting: Oncology

## 2019-02-10 ENCOUNTER — Other Ambulatory Visit: Payer: Self-pay

## 2019-02-10 VITALS — BP 163/93 | HR 76 | Temp 98.6°F | Resp 16 | Wt 165.2 lb

## 2019-02-10 DIAGNOSIS — F419 Anxiety disorder, unspecified: Secondary | ICD-10-CM | POA: Insufficient documentation

## 2019-02-10 DIAGNOSIS — Z9884 Bariatric surgery status: Secondary | ICD-10-CM | POA: Insufficient documentation

## 2019-02-10 DIAGNOSIS — D508 Other iron deficiency anemias: Secondary | ICD-10-CM

## 2019-02-10 DIAGNOSIS — F429 Obsessive-compulsive disorder, unspecified: Secondary | ICD-10-CM | POA: Insufficient documentation

## 2019-02-10 DIAGNOSIS — Z79899 Other long term (current) drug therapy: Secondary | ICD-10-CM | POA: Diagnosis not present

## 2019-02-10 DIAGNOSIS — E538 Deficiency of other specified B group vitamins: Secondary | ICD-10-CM | POA: Insufficient documentation

## 2019-02-10 DIAGNOSIS — F17210 Nicotine dependence, cigarettes, uncomplicated: Secondary | ICD-10-CM | POA: Insufficient documentation

## 2019-02-10 DIAGNOSIS — F329 Major depressive disorder, single episode, unspecified: Secondary | ICD-10-CM | POA: Insufficient documentation

## 2019-02-10 DIAGNOSIS — D509 Iron deficiency anemia, unspecified: Secondary | ICD-10-CM | POA: Diagnosis not present

## 2019-02-10 DIAGNOSIS — F909 Attention-deficit hyperactivity disorder, unspecified type: Secondary | ICD-10-CM | POA: Diagnosis not present

## 2019-02-10 MED ORDER — SODIUM CHLORIDE 0.9 % IV SOLN
Freq: Once | INTRAVENOUS | Status: AC
  Administered 2019-02-10: 16:00:00 via INTRAVENOUS
  Filled 2019-02-10: qty 250

## 2019-02-10 MED ORDER — SODIUM CHLORIDE 0.9 % IV SOLN: 200.0000 mg | Freq: Once | INTRAVENOUS | Status: DC

## 2019-02-10 MED ORDER — IRON SUCROSE 20 MG/ML IV SOLN
200.0000 mg | Freq: Once | INTRAVENOUS | Status: AC
  Administered 2019-02-10: 16:00:00 200 mg via INTRAVENOUS
  Filled 2019-02-10: qty 10

## 2019-02-17 ENCOUNTER — Ambulatory Visit: Payer: 59

## 2019-02-19 ENCOUNTER — Inpatient Hospital Stay: Payer: 59

## 2019-02-19 ENCOUNTER — Other Ambulatory Visit: Payer: Self-pay

## 2019-02-19 VITALS — BP 131/72 | HR 64 | Resp 18

## 2019-02-19 DIAGNOSIS — D509 Iron deficiency anemia, unspecified: Secondary | ICD-10-CM | POA: Diagnosis not present

## 2019-02-19 DIAGNOSIS — D508 Other iron deficiency anemias: Secondary | ICD-10-CM

## 2019-02-19 MED ORDER — SODIUM CHLORIDE 0.9 % IV SOLN
Freq: Once | INTRAVENOUS | Status: AC
  Filled 2019-02-19: qty 250

## 2019-02-19 MED ORDER — IRON SUCROSE 20 MG/ML IV SOLN
200.0000 mg | Freq: Once | INTRAVENOUS | Status: AC
  Administered 2019-02-19: 200 mg via INTRAVENOUS
  Filled 2019-02-19: qty 10

## 2019-02-19 MED ORDER — SODIUM CHLORIDE 0.9 % IV SOLN: 200.0000 mg | Freq: Once | INTRAVENOUS | Status: DC

## 2019-02-24 ENCOUNTER — Inpatient Hospital Stay: Payer: 59

## 2019-03-04 ENCOUNTER — Ambulatory Visit: Payer: 59

## 2019-03-04 ENCOUNTER — Other Ambulatory Visit: Payer: Self-pay

## 2019-03-05 ENCOUNTER — Inpatient Hospital Stay: Payer: 59

## 2019-03-05 ENCOUNTER — Other Ambulatory Visit: Payer: Self-pay

## 2019-03-05 ENCOUNTER — Telehealth: Payer: Self-pay | Admitting: *Deleted

## 2019-03-05 VITALS — BP 108/72 | HR 66

## 2019-03-05 DIAGNOSIS — D508 Other iron deficiency anemias: Secondary | ICD-10-CM

## 2019-03-05 DIAGNOSIS — D509 Iron deficiency anemia, unspecified: Secondary | ICD-10-CM | POA: Diagnosis not present

## 2019-03-05 MED ORDER — SODIUM CHLORIDE 0.9 % IV SOLN
Freq: Once | INTRAVENOUS | Status: AC
  Filled 2019-03-05: qty 250

## 2019-03-05 MED ORDER — IRON SUCROSE 20 MG/ML IV SOLN
200.0000 mg | Freq: Once | INTRAVENOUS | Status: AC
  Administered 2019-03-05: 15:00:00 200 mg via INTRAVENOUS
  Filled 2019-03-05: qty 10

## 2019-03-05 MED ORDER — SODIUM CHLORIDE 0.9 % IV SOLN: 200.0000 mg | Freq: Once | INTRAVENOUS | Status: DC

## 2019-03-05 NOTE — Telephone Encounter (Signed)
Patient called asking that her iron infusion be changed to [facility] because she is still having night sweats and she thinks she will do better with Feraheme than the venofer. She is requesting a return call

## 2019-03-05 NOTE — Telephone Encounter (Signed)
I checked with Luann in prior authorization and she said patient insurance will only pay for Venofer or Ferlicet. I informed patient of this and she said she will just go ahead and get the Venofer since she is in the office now

## 2019-03-05 NOTE — Telephone Encounter (Signed)
I think she was change to venofer for insurance purposes, so we may not have the option of Feraheme.

## 2019-05-11 NOTE — Progress Notes (Deleted)
[place]  Telephone:(336) [phone] Fax:(336) [phone]  ID: Leah Herrera OB: [birth year]  MR#: [id number]  [id number]  Patient Care Team: Harlan Stains, MD as PCP - General (Family Medicine) Harlan Stains, MD (Family Medicine)  I connected with [name] on 05/11/19 at  2:15 PM EST by {Blank single:19197::"video enabled telemedicine visit","telephone visit"} and verified that I am speaking with the correct person using two identifiers.   I discussed the limitations, risks, security and privacy concerns of performing an evaluation and management service by telemedicine and the availability of in-person appointments. I also discussed with the patient that there may be a patient responsible charge related to this service. The patient expressed understanding and agreed to proceed.   Other persons participating in the visit and their role in the encounter: Patient, MD.  Patient's location: Home. Provider's location: Clinic.  CHIEF COMPLAINT:  Iron deficiency anemia  INTERVAL HISTORY: Patient was last evaluated in clinic in October 2018.  She is referred back after having found to have significantly reduced iron stores and hemoglobin.  She also has increased weakness and fatigue.  She has no neurologic complaints.  She denies any recent fevers or illnesses.  She has a good appetite and denies weight loss.  She denies any chest pain, shortness of breath, cough, or hemoptysis.  She has no nausea, vomiting, constipation, or diarrhea. She denies any melena or hematochezia.  She has no urinary complaints.  Patient offers no further specific complaints today.  REVIEW OF SYSTEMS:   Review of Systems  Constitutional: Positive for malaise/fatigue. Negative for fever and weight loss.  Respiratory: Negative.  Negative for cough and shortness of breath.   Cardiovascular: Negative.  Negative for chest pain and leg swelling.  Gastrointestinal: Negative for abdominal pain,  blood in stool and melena.  Genitourinary: Negative.  Negative for dysuria.  Musculoskeletal: Negative.  Negative for back pain.  Skin: Negative.  Negative for rash.  Neurological: Positive for weakness. Negative for sensory change.  Psychiatric/Behavioral: Negative.  The patient is not nervous/anxious and does not have insomnia.     As per HPI. Otherwise, a complete review of systems is negative.  PAST MEDICAL HISTORY: Past Medical History:  Diagnosis Date  . ADHD (attention deficit hyperactivity disorder)   . Anemia    chronic anemia, recieves iron infusions periodically  . Anemia   . 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 Anxiety   . Arthritis   . Cubital tunnel syndrome on right   . Depression   . OCD (obsessive compulsive disorder)   . Vitamin D deficiency     PAST SURGICAL HISTORY: Past Surgical History:  Procedure Laterality Date  . BACK SURGERY    . CHOLECYSTECTOMY    . ESOPHAGOGASTRODUODENOSCOPY (EGD) WITH PROPOFOL N/A 11/27/2014   Procedure: ESOPHAGOGASTRODUODENOSCOPY (EGD) WITH PROPOFOL;  Surgeon: Hulen Luster, MD;  Location: [facility] ENDOSCOPY;  Service: Gastroenterology;  Laterality: N/A;  . ESOPHAGOGASTRODUODENOSCOPY (EGD) WITH PROPOFOL N/A 09/12/2017   Procedure: ESOPHAGOGASTRODUODENOSCOPY (EGD) WITH PROPOFOL;  Surgeon: Toledo, Benay Pike, MD;  Location: [facility] ENDOSCOPY;  Service: Gastroenterology;  Laterality: N/A;  . gastric bipass    . GASTRIC BYPASS  2008  . ORIF TIBIA & FIBULA FRACTURES    . right leg repaired    . transposition and decompression Right elbow  . ULNAR NERVE TRANSPOSITION Right 08/28/2014   Procedure: RIGHT ULNAR NERVE DECOMPRESSION/ AND ANTERIOR TRANSPOSITION;  Surgeon: Roseanne Kaufman, MD;  Location: [place];  Service:  Orthopedics;  Laterality: Right;    FAMILY HISTORY: Reviewed and unchanged. No reported history of malignancy or chronic disease.     ADVANCED DIRECTIVES:    HEALTH  MAINTENANCE: Social History   Tobacco Use  . Smoking status: Current Every Day Smoker    Packs/day: 1.00    Types: Cigarettes  . Smokeless tobacco: Never Used  Substance Use Topics  . Alcohol use: No    Comment: social  . Drug use: No     Allergies  Allergen Reactions  . Penicillins Hives and Rash  . Penicillins Anaphylaxis    Current Outpatient Medications  Medication Sig Dispense Refill  . buPROPion (WELLBUTRIN SR) 150 MG 12 hr tablet Take 150 mg by mouth daily.    . cloNIDine (CATAPRES) 0.1 MG tablet Take 0.1 mg by mouth daily as needed.    . Eszopiclone 3 MG TABS Take 3 mg by mouth at bedtime as needed.    Marland Kitchen lisdexamfetamine (VYVANSE) 30 MG capsule Take 30 mg by mouth 2 (two) times daily.    . ondansetron (ZOFRAN-ODT) 4 MG disintegrating tablet Take 4 mg by mouth every 8 (eight) hours as needed for nausea or vomiting.    . propranolol (INDERAL) 10 MG tablet Take 10 mg by mouth 3 (three) times daily as needed.    . sertraline (ZOLOFT) 100 MG tablet Take 100 mg by mouth at bedtime.     No current facility-administered medications for this visit.   Facility-Administered Medications Ordered in Other Visits  Medication Dose Route Frequency Provider Last Rate Last Admin  . 0.9 %  sodium chloride infusion   Intravenous Continuous Lloyd Huger, MD   Stopped at 09/20/17 1315    OBJECTIVE: There were no vitals filed for this visit.   There is no height or weight on file to calculate BMI.    ECOG FS:0 - Asymptomatic  General: Well-developed, well-nourished, no acute distress. Eyes: Pink conjunctiva, anicteric sclera. HEENT: Normocephalic, moist mucous membranes. Lungs: Clear to auscultation bilaterally. Heart: Regular rate and rhythm. No rubs, murmurs, or gallops. Abdomen: Soft, nontender, nondistended. No organomegaly noted, normoactive bowel sounds. Musculoskeletal: No edema, cyanosis, or clubbing. Neuro: Alert, answering all questions appropriately. Cranial nerves  grossly intact. Skin: No rashes or petechiae noted. Psych: Normal affect.  LAB RESULTS:  Lab Results  Component Value Date   NA 136 01/11/2019   K 4.4 01/11/2019   CL 104 01/11/2019   CO2 21 (L) 01/11/2019   GLUCOSE 106 (H) 01/11/2019   BUN 15 01/11/2019   CREATININE 1.09 (H) 01/11/2019   CALCIUM 9.2 01/11/2019   PROT 7.2 11/12/2017   ALBUMIN 4.3 11/12/2017   AST 21 11/12/2017   ALT 14 11/12/2017   ALKPHOS 44 11/12/2017   BILITOT 0.6 11/12/2017   GFRNONAA >60 01/11/2019   GFRAA >60 01/11/2019    Lab Results  Component Value Date   WBC 8.7 02/04/2019   NEUTROABS 5.5 02/04/2019   HGB 8.8 (L) 02/04/2019   HCT 31.4 (L) 02/04/2019   MCV 77.3 (L) 02/04/2019   PLT 352 02/04/2019   Lab Results  Component Value Date   IRON 10 (L) 02/04/2019   TIBC 518 (H) 02/04/2019   IRONPCTSAT 2 (L) 02/04/2019    Lab Results  Component Value Date   FERRITIN 4 (L) 02/04/2019     STUDIES: No results found.  ASSESSMENT: Iron deficiency anemia.  PLAN:    1.  Iron deficiency anemia: Likely secondary to malabsorption status post gastric bypass.  Patient's hemoglobin  has trended down to 8.8 and she also has significantly reduced iron stores.  Proceed with 200 mg IV Venofer today.  Patient return to clinic in 1, 2, and 3 weeks for additional treatment.  She would then return to clinic in 3 months with repeat laboratory work and video assisted telemedicine visit to discuss her results.  Patient lives in the [place] area and have recommended a transfer of care to [facility] where she works.  Patient does not wish to transfer at this time, but will consider it in the future.   2. Insomnia: Patient does not complain of this today. 3.  B12 deficiency: Patient's B12 levels are now within normal limits.  I provided *** minutes of {Blank single:19197::"face-to-face video visit time","non face-to-face telephone visit time"} during this encounter which included chart review, counseling,  and coordination of care as documented above.   Patient expressed understanding and was in agreement with this plan. She also understands that She can call clinic at any time with any questions, concerns, or complaints.    Lloyd Huger, MD   05/11/2019 6:55 PM

## 2019-05-15 ENCOUNTER — Inpatient Hospital Stay: Payer: 59 | Admitting: Oncology

## 2019-05-15 ENCOUNTER — Ambulatory Visit: Payer: 59

## 2019-05-15 ENCOUNTER — Other Ambulatory Visit: Payer: 59

## 2019-10-09 IMAGING — DX DG ABDOMEN ACUTE W/ 1V CHEST
3 series · 3 of 3 positions shown · non-contrast
Comparison: None

CLINICAL DATA: Acute epigastric pain.

EXAM:
DG ABDOMEN ACUTE W/ 1V CHEST

[chest pa]
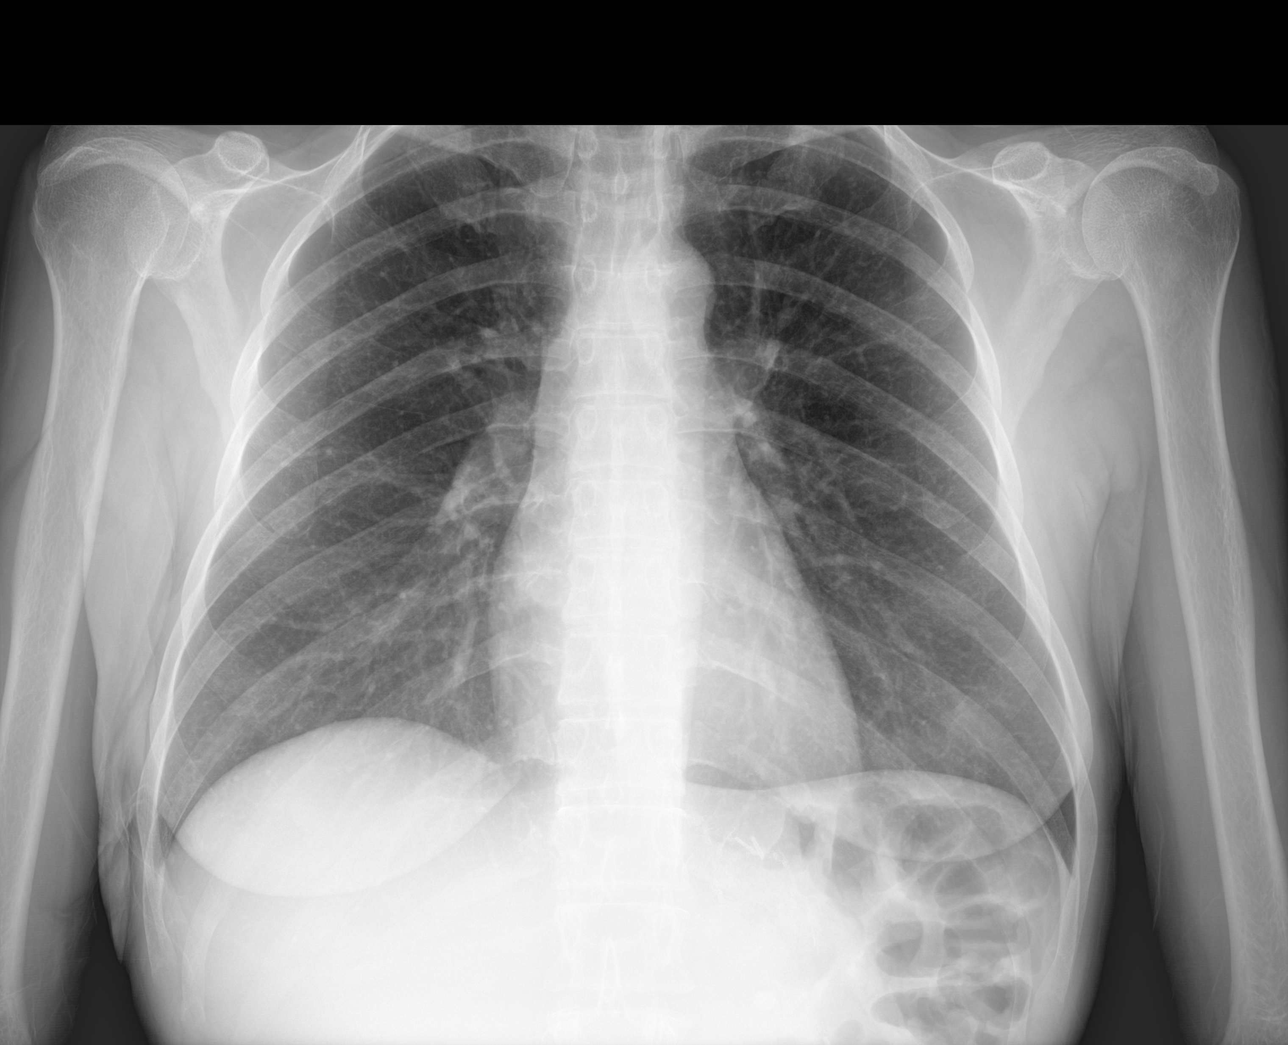

[abdomen erect]
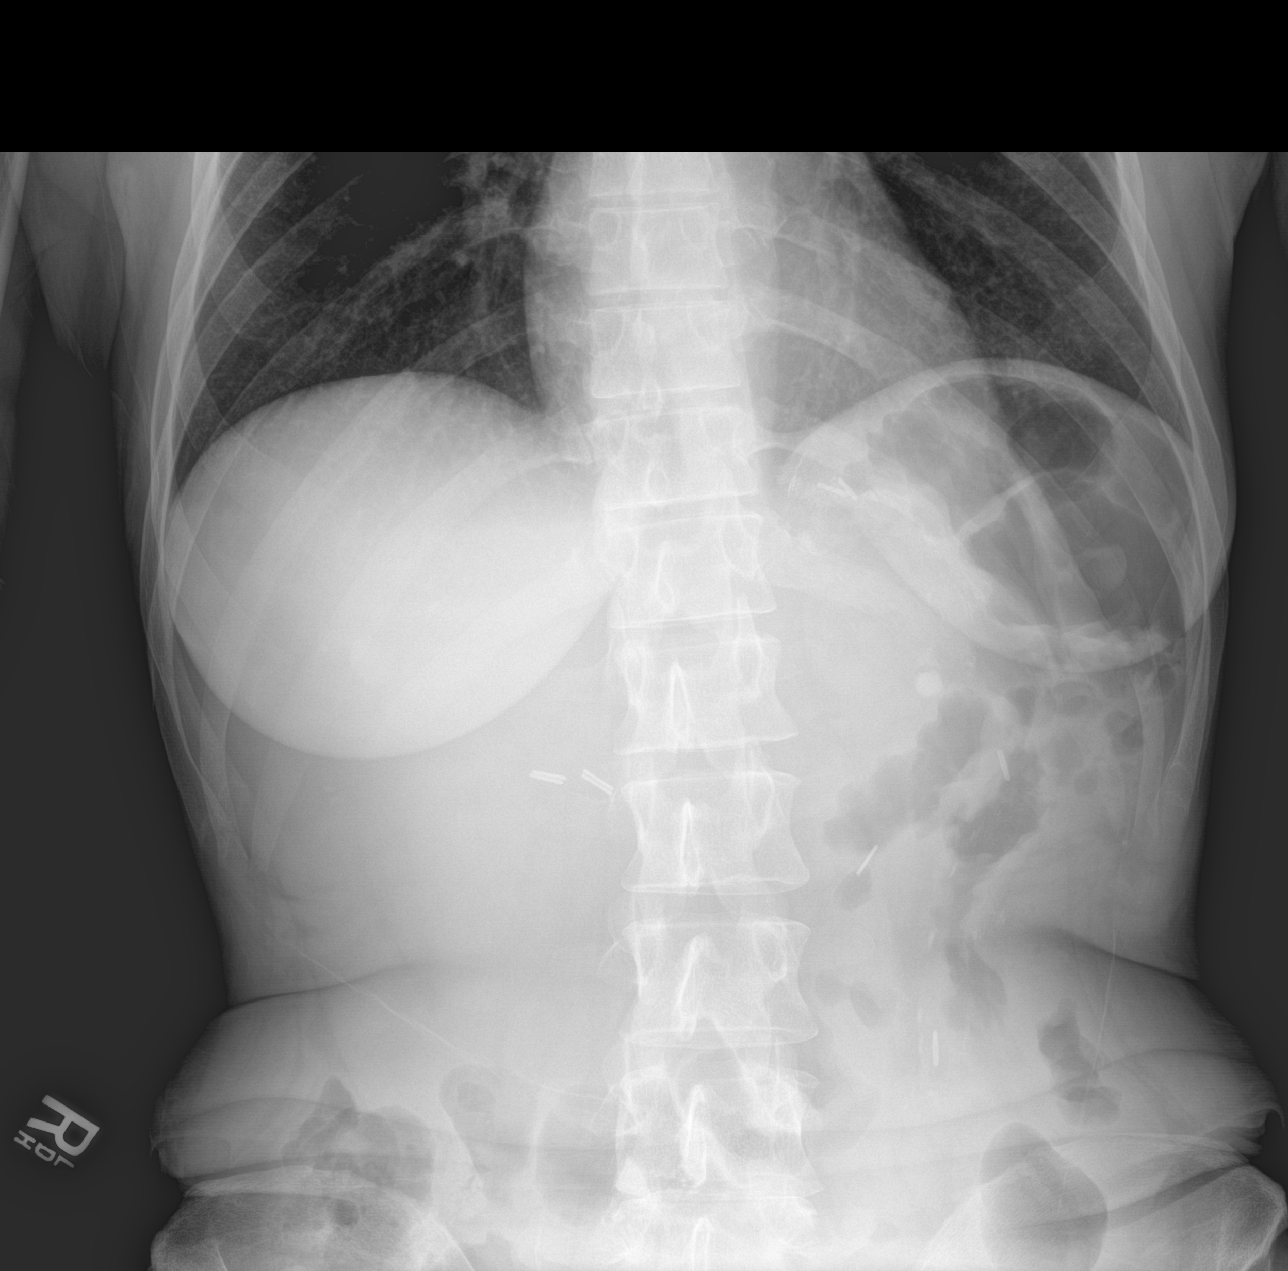

[abdomen supine]
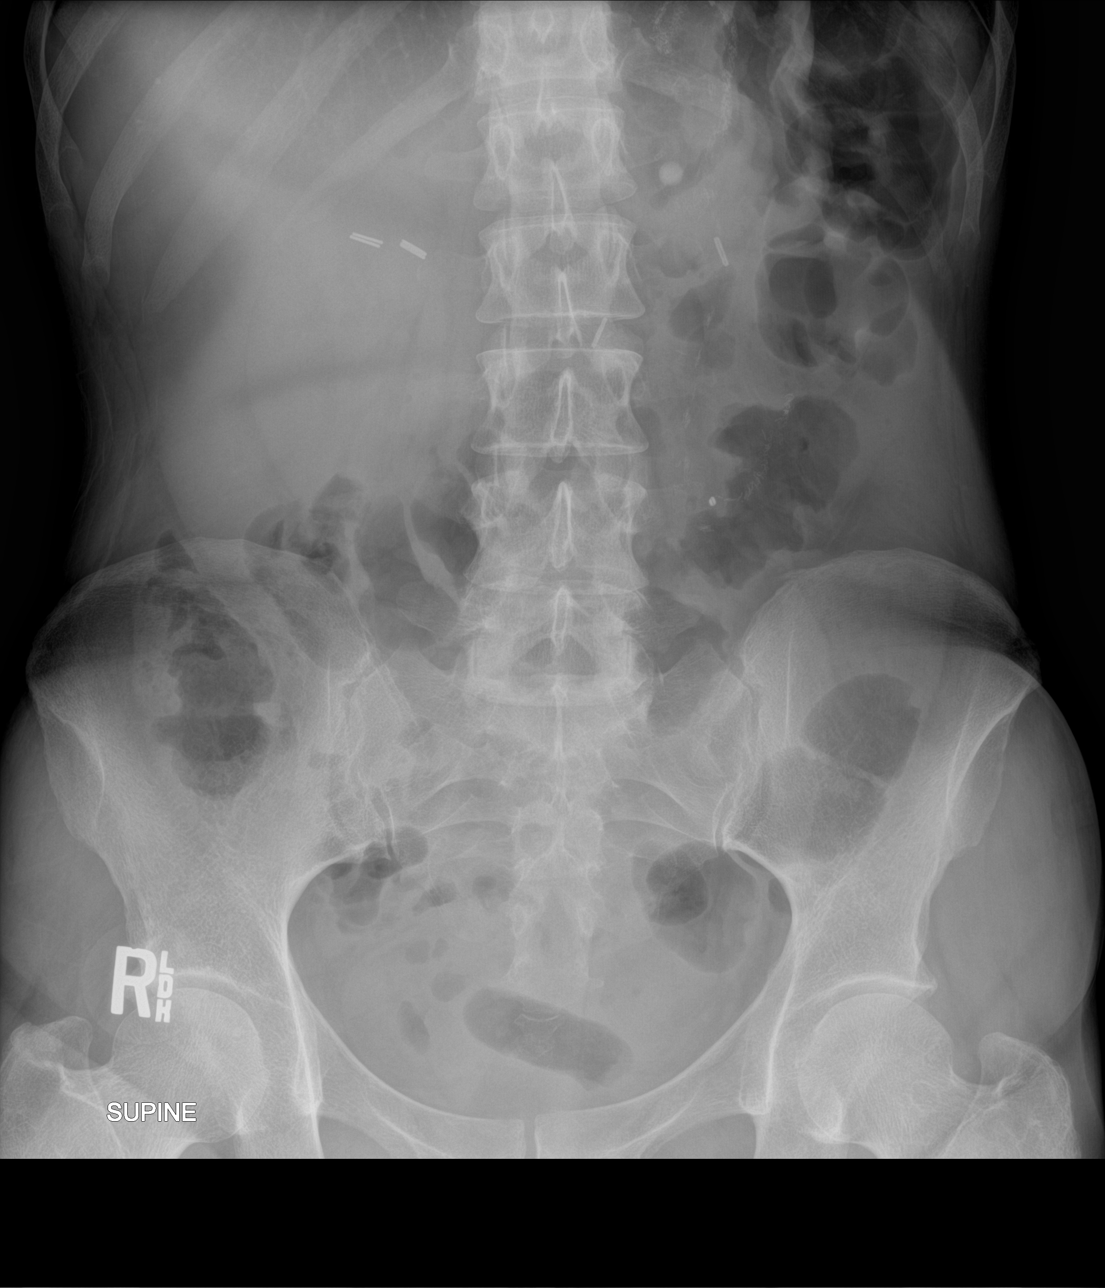

[3 of 3 positions shown; findings below may reference images not displayed]

FINDINGS: There is no evidence of dilated bowel loops or free intraperitoneal
air. Calcification overlying the left kidney measures 6 mm. Heart
size and mediastinal contours are within normal limits. Both lungs
are clear.
IMPRESSION: 1. No acute cardiopulmonary abnormality.
2. Nonobstructive bowel gas pattern.
3. Left renal calculus measures 6 mm.

## 2019-12-02 ENCOUNTER — Other Ambulatory Visit: Payer: Self-pay | Admitting: Family Medicine

## 2019-12-02 ENCOUNTER — Other Ambulatory Visit (HOSPITAL_COMMUNITY): Payer: Self-pay | Admitting: Family Medicine

## 2019-12-02 DIAGNOSIS — M5412 Radiculopathy, cervical region: Secondary | ICD-10-CM

## 2019-12-03 ENCOUNTER — Other Ambulatory Visit (HOSPITAL_COMMUNITY): Payer: Self-pay | Admitting: Family Medicine

## 2019-12-03 ENCOUNTER — Ambulatory Visit (HOSPITAL_COMMUNITY)
Admission: RE | Admit: 2019-12-03 | Discharge: 2019-12-03 | Disposition: A | Discharge status: Home / Self Care | Payer: 59 | Source: Ambulatory Visit | Attending: Family Medicine | Admitting: Family Medicine

## 2019-12-03 ENCOUNTER — Other Ambulatory Visit: Payer: Self-pay

## 2019-12-03 DIAGNOSIS — M5412 Radiculopathy, cervical region: Secondary | ICD-10-CM | POA: Diagnosis not present

## 2020-09-22 ENCOUNTER — Encounter: Payer: Self-pay | Admitting: Oncology

## 2020-09-22 ENCOUNTER — Other Ambulatory Visit (HOSPITAL_COMMUNITY): Payer: Self-pay

## 2020-09-22 MED ORDER — BUPROPION HCL ER (XL) 300 MG PO TB24
300.0000 mg | ORAL_TABLET | Freq: Every morning | ORAL | 1 refills | Status: DC
  Filled 2020-10-04: qty 30, 30d supply, fill #0
  Filled 2020-12-21: qty 30, 30d supply, fill #1

## 2020-09-22 MED ORDER — PROPRANOLOL HCL 10 MG PO TABS
10.0000 mg | ORAL_TABLET | Freq: Three times a day (TID) | ORAL | 0 refills | Status: DC | PRN
  Filled 2020-10-04: qty 90, 30d supply, fill #0

## 2020-09-22 MED ORDER — SERTRALINE HCL 100 MG PO TABS
100.0000 mg | ORAL_TABLET | Freq: Every day | ORAL | 5 refills | Status: DC
  Filled 2020-10-04: qty 60, 60d supply, fill #0

## 2020-09-22 MED ORDER — HYDROXYZINE HCL 25 MG PO TABS
50.0000 mg | ORAL_TABLET | Freq: Every day | ORAL | 5 refills | Status: DC
Start: 2020-05-11 — End: 2022-04-29
  Filled 2020-10-04: qty 180, 90d supply, fill #0
  Filled 2020-12-21: qty 180, 90d supply, fill #1

## 2020-09-22 MED ORDER — TOPIRAMATE 100 MG PO TABS
100.0000 mg | ORAL_TABLET | Freq: Every day | ORAL | 1 refills | Status: DC
  Filled 2020-10-04: qty 30, 30d supply, fill #0

## 2020-09-22 MED ORDER — XIIDRA 5 % OP SOLN
1.0000 [drp] | Freq: Two times a day (BID) | OPHTHALMIC | 11 refills | Status: DC
Start: 2020-05-11 — End: 2021-05-25

## 2020-09-22 MED ORDER — OMEPRAZOLE 40 MG PO CPDR
40.0000 mg | DELAYED_RELEASE_CAPSULE | Freq: Two times a day (BID) | ORAL | 5 refills | Status: DC
  Filled 2020-10-04: qty 120, 60d supply, fill #0

## 2020-10-04 ENCOUNTER — Other Ambulatory Visit (HOSPITAL_COMMUNITY): Payer: Self-pay

## 2020-10-14 ENCOUNTER — Encounter: Payer: Self-pay | Admitting: Oncology

## 2020-10-14 ENCOUNTER — Other Ambulatory Visit (HOSPITAL_COMMUNITY): Payer: Self-pay

## 2020-10-14 MED ORDER — ESZOPICLONE 3 MG PO TABS
3.0000 mg | ORAL_TABLET | Freq: Every day | ORAL | 2 refills | Status: DC
  Filled 2020-10-14: qty 30, 30d supply, fill #0
  Filled 2020-12-21: qty 30, 30d supply, fill #1
  Filled 2021-02-15: qty 30, 30d supply, fill #2

## 2020-10-14 MED ORDER — ROPINIROLE HCL 2 MG PO TABS
2.0000 mg | ORAL_TABLET | Freq: Every evening | ORAL | 0 refills | Status: DC | PRN
  Filled 2020-10-14: qty 30, 30d supply, fill #0

## 2020-10-14 MED ORDER — DEXTROAMPHETAMINE SULFATE 10 MG PO TABS
10.0000 mg | ORAL_TABLET | Freq: Every morning | ORAL | 0 refills | Status: DC
  Filled 2020-10-14: qty 30, 30d supply, fill #0

## 2020-10-14 MED ORDER — BUPROPION HCL ER (XL) 300 MG PO TB24
300.0000 mg | ORAL_TABLET | Freq: Every morning | ORAL | 1 refills | Status: DC
Start: 2020-10-14 — End: 2021-05-25
  Filled 2020-10-14: qty 90, 90d supply, fill #0

## 2020-10-14 MED ORDER — OMEPRAZOLE 40 MG PO CPDR
40.0000 mg | DELAYED_RELEASE_CAPSULE | Freq: Two times a day (BID) | ORAL | 1 refills | Status: DC
  Filled 2020-10-14 – 2020-12-21 (×2): qty 180, 90d supply, fill #0
  Filled 2021-09-28: qty 180, 90d supply, fill #1

## 2020-10-14 MED ORDER — ALPRAZOLAM 0.5 MG PO TABS
0.5000 mg | ORAL_TABLET | ORAL | 2 refills | Status: DC
  Filled 2020-10-14: qty 30, 30d supply, fill #0
  Filled 2020-12-21: qty 30, 30d supply, fill #1
  Filled 2021-02-15: qty 30, 30d supply, fill #2

## 2020-10-14 MED ORDER — HYDROXYZINE HCL 25 MG PO TABS
50.0000 mg | ORAL_TABLET | Freq: Every day | ORAL | 1 refills | Status: DC
  Filled 2020-10-14 – 2021-03-30 (×2): qty 180, 90d supply, fill #0

## 2020-10-14 MED ORDER — VYVANSE 60 MG PO CAPS
60.0000 mg | ORAL_CAPSULE | Freq: Every day | ORAL | 0 refills | Status: DC
  Filled 2020-10-14: qty 30, 30d supply, fill #0

## 2020-12-21 ENCOUNTER — Other Ambulatory Visit (HOSPITAL_COMMUNITY): Payer: Self-pay

## 2020-12-23 ENCOUNTER — Other Ambulatory Visit (HOSPITAL_COMMUNITY): Payer: Self-pay

## 2020-12-27 ENCOUNTER — Other Ambulatory Visit (HOSPITAL_COMMUNITY): Payer: Self-pay

## 2020-12-27 MED ORDER — ROPINIROLE HCL 2 MG PO TABS
2.0000 mg | ORAL_TABLET | Freq: Every evening | ORAL | 0 refills | Status: DC | PRN
  Filled 2020-12-27: qty 30, 30d supply, fill #0

## 2021-01-10 ENCOUNTER — Other Ambulatory Visit (HOSPITAL_COMMUNITY): Payer: Self-pay

## 2021-01-10 MED ORDER — VYVANSE 60 MG PO CAPS
60.0000 mg | ORAL_CAPSULE | Freq: Every day | ORAL | 0 refills | Status: DC
  Filled 2021-01-10: qty 30, 30d supply, fill #0

## 2021-01-10 MED ORDER — DEXTROAMPHETAMINE SULFATE 10 MG PO TABS
10.0000 mg | ORAL_TABLET | Freq: Every morning | ORAL | 0 refills | Status: DC
  Filled 2021-01-10: qty 30, 30d supply, fill #0

## 2021-01-10 MED ORDER — ROPINIROLE HCL 2 MG PO TABS
2.0000 mg | ORAL_TABLET | Freq: Every day | ORAL | 3 refills | Status: DC
  Filled 2021-01-10 – 2021-09-28 (×3): qty 30, 30d supply, fill #0
  Filled 2021-11-20: qty 30, 30d supply, fill #1

## 2021-01-10 MED ORDER — ESZOPICLONE 3 MG PO TABS
3.0000 mg | ORAL_TABLET | Freq: Every day | ORAL | 3 refills | Status: DC
  Filled 2021-01-10 – 2021-03-30 (×2): qty 30, 30d supply, fill #0
  Filled 2021-05-10: qty 30, 30d supply, fill #1

## 2021-02-15 ENCOUNTER — Other Ambulatory Visit (HOSPITAL_COMMUNITY): Payer: Self-pay

## 2021-02-16 ENCOUNTER — Other Ambulatory Visit (HOSPITAL_COMMUNITY): Payer: Self-pay

## 2021-02-16 MED ORDER — BUPROPION HCL ER (XL) 300 MG PO TB24
300.0000 mg | ORAL_TABLET | Freq: Every morning | ORAL | 1 refills | Status: DC
  Filled 2021-02-16: qty 90, 90d supply, fill #0
  Filled 2021-05-22: qty 90, 90d supply, fill #1

## 2021-02-16 MED ORDER — DEXTROAMPHETAMINE SULFATE 10 MG PO TABS
10.0000 mg | ORAL_TABLET | Freq: Every morning | ORAL | 0 refills | Status: DC
  Filled 2021-02-16: qty 30, 30d supply, fill #0

## 2021-02-16 MED ORDER — VYVANSE 60 MG PO CAPS
60.0000 mg | ORAL_CAPSULE | Freq: Every day | ORAL | 0 refills | Status: DC
  Filled 2021-02-16: qty 30, 30d supply, fill #0

## 2021-02-16 MED ORDER — ROPINIROLE HCL 2 MG PO TABS
2.0000 mg | ORAL_TABLET | Freq: Every evening | ORAL | 0 refills | Status: DC | PRN
  Filled 2021-02-16: qty 30, 30d supply, fill #0

## 2021-02-17 ENCOUNTER — Other Ambulatory Visit (HOSPITAL_COMMUNITY): Payer: Self-pay

## 2021-03-23 ENCOUNTER — Other Ambulatory Visit (HOSPITAL_COMMUNITY): Payer: Self-pay

## 2021-03-24 ENCOUNTER — Other Ambulatory Visit (HOSPITAL_COMMUNITY): Payer: Self-pay

## 2021-03-24 MED ORDER — ROPINIROLE HCL 2 MG PO TABS
2.0000 mg | ORAL_TABLET | Freq: Every evening | ORAL | 0 refills | Status: DC | PRN
  Filled 2021-03-24: qty 30, 30d supply, fill #0

## 2021-03-25 ENCOUNTER — Other Ambulatory Visit (HOSPITAL_COMMUNITY): Payer: Self-pay

## 2021-03-28 ENCOUNTER — Other Ambulatory Visit (HOSPITAL_COMMUNITY): Payer: Self-pay

## 2021-03-29 ENCOUNTER — Other Ambulatory Visit (HOSPITAL_COMMUNITY): Payer: Self-pay

## 2021-03-30 ENCOUNTER — Other Ambulatory Visit (HOSPITAL_COMMUNITY): Payer: Self-pay

## 2021-03-31 ENCOUNTER — Other Ambulatory Visit (HOSPITAL_COMMUNITY): Payer: Self-pay

## 2021-03-31 MED ORDER — PROPRANOLOL HCL 10 MG PO TABS
10.0000 mg | ORAL_TABLET | Freq: Three times a day (TID) | ORAL | 0 refills | Status: DC | PRN
  Filled 2021-03-31: qty 90, 30d supply, fill #0

## 2021-03-31 MED ORDER — HYDROXYZINE HCL 25 MG PO TABS
50.0000 mg | ORAL_TABLET | Freq: Every day | ORAL | 1 refills | Status: DC
  Filled 2021-03-31: qty 180, 90d supply, fill #0

## 2021-03-31 MED ORDER — ESZOPICLONE 3 MG PO TABS
3.0000 mg | ORAL_TABLET | Freq: Every evening | ORAL | 3 refills | Status: DC
  Filled 2021-03-31: qty 30, 30d supply, fill #0

## 2021-03-31 MED ORDER — OMEPRAZOLE 40 MG PO CPDR
40.0000 mg | DELAYED_RELEASE_CAPSULE | Freq: Two times a day (BID) | ORAL | 1 refills | Status: DC
Start: 2021-03-31 — End: 2022-04-29
  Filled 2021-03-31: qty 180, 90d supply, fill #0
  Filled 2021-07-08: qty 180, 90d supply, fill #1

## 2021-03-31 MED ORDER — ALPRAZOLAM 0.5 MG PO TABS
0.5000 mg | ORAL_TABLET | Freq: Every day | ORAL | 2 refills | Status: DC | PRN
  Filled 2021-03-31: qty 30, 30d supply, fill #0

## 2021-03-31 MED ORDER — VYVANSE 60 MG PO CAPS
60.0000 mg | ORAL_CAPSULE | Freq: Every day | ORAL | 0 refills | Status: DC
  Filled 2021-03-31: qty 30, 30d supply, fill #0

## 2021-03-31 MED ORDER — DEXTROAMPHETAMINE SULFATE 10 MG PO TABS
10.0000 mg | ORAL_TABLET | Freq: Every morning | ORAL | 0 refills | Status: DC
Start: 2021-03-31 — End: 2021-05-12
  Filled 2021-03-31: qty 30, 30d supply, fill #0

## 2021-04-28 ENCOUNTER — Other Ambulatory Visit (HOSPITAL_COMMUNITY): Payer: Self-pay

## 2021-04-28 MED ORDER — ROPINIROLE HCL 2 MG PO TABS
4.0000 mg | ORAL_TABLET | Freq: Every day | ORAL | 1 refills | Status: DC
  Filled 2021-04-28: qty 60, 30d supply, fill #0

## 2021-05-05 ENCOUNTER — Other Ambulatory Visit (HOSPITAL_COMMUNITY): Payer: Self-pay

## 2021-05-05 MED ORDER — ERGOCALCIFEROL 1.25 MG (50000 UT) PO CAPS
50000.0000 [IU] | ORAL_CAPSULE | ORAL | 0 refills | Status: DC
  Filled 2021-05-05: qty 8, 56d supply, fill #0

## 2021-05-09 ENCOUNTER — Telehealth: Payer: Self-pay | Admitting: *Deleted

## 2021-05-09 NOTE — Telephone Encounter (Signed)
Patient wants to re-establish care with MD . ?

## 2021-05-11 ENCOUNTER — Other Ambulatory Visit (HOSPITAL_COMMUNITY): Payer: Self-pay

## 2021-05-12 ENCOUNTER — Other Ambulatory Visit (HOSPITAL_COMMUNITY): Payer: Self-pay

## 2021-05-12 MED ORDER — VYVANSE 60 MG PO CAPS
60.0000 mg | ORAL_CAPSULE | Freq: Every day | ORAL | 0 refills | Status: DC
  Filled 2021-05-12: qty 30, 30d supply, fill #0

## 2021-05-12 MED ORDER — DEXTROAMPHETAMINE SULFATE 10 MG PO TABS
10.0000 mg | ORAL_TABLET | Freq: Every morning | ORAL | 0 refills | Status: DC
  Filled 2021-05-12: qty 30, 30d supply, fill #0

## 2021-05-13 ENCOUNTER — Encounter: Payer: Self-pay | Admitting: Oncology

## 2021-05-13 ENCOUNTER — Other Ambulatory Visit (HOSPITAL_COMMUNITY): Payer: Self-pay

## 2021-05-18 ENCOUNTER — Encounter: Payer: Self-pay | Admitting: Oncology

## 2021-05-22 NOTE — Progress Notes (Signed)
?[place]  ?[phone] [phone] ? ?ID: Newt Minion OB: 03-12-1975  MR#: [id number]  ZHG#:[id number] ? ?Patient Care Team: ?Harlan Stains, MD as PCP - General (Family Medicine) ?Harlan Stains, MD (Family Medicine) ? ?CHIEF COMPLAINT: Iron deficiency anemia. ? ?INTERVAL HISTORY: Patient is a 46 year old female who was last evaluated in clinic in December 2020.  She is referred back for declining hemoglobin and iron stores as well as increasing weakness and fatigue.  She otherwise feels well.  She has no neurologic complaints.  She denies any recent fevers or illnesses.  She has a good appetite and denies weight loss.  She has no chest pain, shortness of breath, cough, or hemoptysis.  She denies any nausea, vomiting, constipation, or diarrhea.  She has no melena or hematochezia.  She has no urinary complaints.  Patient otherwise feels well and offers no further specific complaints today. ? ?REVIEW OF SYSTEMS:   ?Review of Systems  ?Constitutional:  Positive for malaise/fatigue. Negative for fever and weight loss.  ?Respiratory: Negative.  Negative for cough, hemoptysis and shortness of breath.   ?Cardiovascular: Negative.  Negative for chest pain and leg swelling.  ?Gastrointestinal: Negative.  Negative for abdominal pain, blood in stool and melena.  ?Genitourinary: Negative.  Negative for hematuria.  ?Musculoskeletal: Negative.  Negative for back pain.  ?Skin: Negative.  Negative for rash.  ?Neurological:  Positive for weakness. Negative for dizziness, focal weakness and headaches.  ?Psychiatric/Behavioral: Negative.  The patient is not nervous/anxious.   ? ?As per HPI. Otherwise, a complete review of systems is negative. ? ?PAST MEDICAL HISTORY: ?Past Medical History:  ?Diagnosis Date  ? ADHD (attention deficit hyperactivity disorder)   ? Anemia   ? chronic anemia, recieves iron infusions periodically  ? Anemia   ? 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 Anxiety   ? Arthritis   ? Cubital tunnel syndrome on right   ? Depression   ? OCD (obsessive compulsive disorder)   ? Vitamin D deficiency   ? ? ?PAST SURGICAL HISTORY: ?Past Surgical History:  ?Procedure Laterality Date  ? BACK SURGERY    ? CHOLECYSTECTOMY    ? ESOPHAGOGASTRODUODENOSCOPY (EGD) WITH PROPOFOL N/A 11/27/2014  ? Procedure: ESOPHAGOGASTRODUODENOSCOPY (EGD) WITH PROPOFOL;  Surgeon: Hulen Luster, MD;  Location: [facility] ENDOSCOPY;  Service: Gastroenterology;  Laterality: N/A;  ? ESOPHAGOGASTRODUODENOSCOPY (EGD) WITH PROPOFOL N/A 09/12/2017  ? Procedure: ESOPHAGOGASTRODUODENOSCOPY (EGD) WITH PROPOFOL;  Surgeon: Toledo, Benay Pike, MD;  Location: [facility] ENDOSCOPY;  Service: Gastroenterology;  Laterality: N/A;  ? gastric bipass    ? GASTRIC BYPASS  2008  ? ORIF TIBIA & FIBULA FRACTURES    ? right leg repaired    ? transposition and decompression Right elbow  ? ULNAR NERVE TRANSPOSITION Right 08/28/2014  ? Procedure: RIGHT ULNAR NERVE DECOMPRESSION/ AND ANTERIOR TRANSPOSITION;  Surgeon: Roseanne Kaufman, MD;  Location: [place];  Service: Orthopedics;  Laterality: Right;  ? ? ?FAMILY HISTORY: ?Family History  ?Problem Relation Age of Onset  ? Lung cancer Maternal Aunt   ? Lung cancer Maternal Grandmother   ? Lung cancer Maternal Aunt   ? ? ?ADVANCED DIRECTIVES (Y/N):  N ? ?HEALTH MAINTENANCE: ?Social History  ? ?Tobacco Use  ? Smoking status: Every Day  ?  Packs/day: 1.00  ?  Types: Cigarettes  ? Smokeless tobacco: Never  ?Vaping Use  ? Vaping Use: Never used  ?Substance Use Topics  ? Alcohol use: No  ?  Comment: social  ?  Drug use: No  ? ? ? Colonoscopy: ? PAP: ? Bone density: ? Lipid panel: ? ?Allergies  ?Allergen Reactions  ? Penicillins Hives and Rash  ? Penicillins Anaphylaxis  ? ? ?Current Outpatient Medications  ?Medication Sig Dispense Refill  ? ALPRAZolam (XANAX) 0.5 MG tablet Take 1 tablet (0.5 mg total) by mouth one time as needed for anxiety. 30 tablet 2  ?  buPROPion (WELLBUTRIN SR) 150 MG 12 hr tablet Take 150 mg by mouth daily.    ? dextroamphetamine (DEXTROSTAT) 10 MG tablet Take 1 tablet (10 mg total) by mouth every morning. 30 tablet 0  ? ergocalciferol (VITAMIN D2) 1.25 MG (50000 UT) capsule Take 1 capsule (50,000 Units total) by mouth once a week. 8 capsule 0  ? Eszopiclone 3 MG TABS Take 3 mg by mouth at bedtime as needed.    ? hydrOXYzine (ATARAX/VISTARIL) 25 MG tablet Take 2 tablets (50 mg total) by mouth at bedtime. 180 tablet 5  ? lisdexamfetamine (VYVANSE) 60 MG capsule Take 1 capsule (60 mg total) by mouth daily in the afternoon. 30 capsule 0  ? omeprazole (PRILOSEC) 40 MG capsule Take 1 capsule (40 mg total) by mouth 2 (two) times daily for GERD. 180 capsule 1  ? omeprazole (PRILOSEC) 40 MG capsule Take 1 capsule (40 mg total) by mouth 2 (two) times daily for GERD 180 capsule 1  ? propranolol (INDERAL) 10 MG tablet Take 10 mg by mouth 3 (three) times daily as needed.    ? rOPINIRole (REQUIP) 2 MG tablet Take 1 tablet (2 mg total) by mouth 1-3 hours before bedtime. 30 tablet 3  ? sertraline (ZOLOFT) 100 MG tablet Take 100 mg by mouth at bedtime.    ? topiramate (TOPAMAX) 100 MG tablet Take 1 tablet (100 mg total) by mouth at bedtime. 30 tablet 1  ? ?No current facility-administered medications for this visit.  ? ?Facility-Administered Medications Ordered in Other Visits  ?Medication Dose Route Frequency Provider Last Rate Last Admin  ? 0.9 %  sodium chloride infusion   Intravenous Continuous Lloyd Huger, MD   Stopped at 09/20/17 1315  ? ? ?OBJECTIVE: ?Vitals:  ? 05/25/21 1034  ?BP: 136/84  ?Pulse: 72  ?Resp: 16  ?Temp: 97.6 ?F (36.4 ?C)  ?SpO2: 100%  ?   Body mass index is 34.67 kg/m?Marland Kitchen    ECOG FS:1 - Symptomatic but completely ambulatory ? ?General: Well-developed, well-nourished, no acute distress. ?Eyes: Pink conjunctiva, anicteric sclera. ?HEENT: Normocephalic, moist mucous membranes. ?Lungs: No audible wheezing or coughing. ?Heart: Regular  rate and rhythm. ?Abdomen: Soft, nontender, no obvious distention. ?Musculoskeletal: No edema, cyanosis, or clubbing. ?Neuro: Alert, answering all questions appropriately. Cranial nerves grossly intact. ?Skin: No rashes or petechiae noted. ?Psych: Normal affect. ?Lymphatics: No cervical, calvicular, axillary or inguinal LAD. ? ? ?LAB RESULTS: ? ?Lab Results  ?Component Value Date  ? NA 136 01/11/2019  ? K 4.4 01/11/2019  ? CL 104 01/11/2019  ? CO2 21 (L) 01/11/2019  ? GLUCOSE 106 (H) 01/11/2019  ? BUN 15 01/11/2019  ? CREATININE 1.09 (H) 01/11/2019  ? CALCIUM 9.2 01/11/2019  ? PROT 7.2 11/12/2017  ? ALBUMIN 4.3 11/12/2017  ? AST 21 11/12/2017  ? ALT 14 11/12/2017  ? ALKPHOS 44 11/12/2017  ? BILITOT 0.6 11/12/2017  ? GFRNONAA >60 01/11/2019  ? GFRAA >60 01/11/2019  ? ? ?Lab Results  ?Component Value Date  ? WBC 8.7 02/04/2019  ? NEUTROABS 5.5 02/04/2019  ? HGB 8.8 (L) 02/04/2019  ? HCT  31.4 (L) 02/04/2019  ? MCV 77.3 (L) 02/04/2019  ? PLT 352 02/04/2019  ? ?Lab Results  ?Component Value Date  ? IRON 10 (L) 02/04/2019  ? TIBC 518 (H) 02/04/2019  ? IRONPCTSAT 2 (L) 02/04/2019  ? ?Lab Results  ?Component Value Date  ? FERRITIN 4 (L) 02/04/2019  ? ? ? ?STUDIES: ?No results found. ? ?ASSESSMENT: Iron deficiency anemia. ? ?PLAN:   ? ?Iron deficiency anemia: Patient's hemoglobin and iron stores have trended down and she is symptomatic.  Proceed with 200 mg IV Venofer today.  Patient will then return to clinic 4 times over the next several weeks to receive additional treatment.  She will then return to clinic in 3 months with repeat laboratory work, further evaluation, and consideration of treatment if needed. ? ?I spent a total of 30 minutes reviewing chart data, face-to-face evaluation with the patient, counseling and coordination of care as detailed above. ? ? ?Patient expressed understanding and was in agreement with this plan. She also understands that She can call clinic at any time with any questions, concerns, or  complaints.  ? ? ?Lloyd Huger, MD   05/26/2021 8:49 AM ? ? ? ? ?

## 2021-05-23 ENCOUNTER — Other Ambulatory Visit (HOSPITAL_COMMUNITY): Payer: Self-pay

## 2021-05-25 ENCOUNTER — Other Ambulatory Visit: Payer: Self-pay

## 2021-05-25 ENCOUNTER — Inpatient Hospital Stay: Payer: No Typology Code available for payment source | Attending: Oncology | Admitting: Oncology

## 2021-05-25 ENCOUNTER — Inpatient Hospital Stay: Payer: No Typology Code available for payment source

## 2021-05-25 ENCOUNTER — Encounter: Payer: Self-pay | Admitting: Oncology

## 2021-05-25 VITALS — BP 125/77 | HR 73 | Temp 97.6°F | Resp 17

## 2021-05-25 VITALS — BP 136/84 | HR 72 | Temp 97.6°F | Resp 16 | Ht 64.0 in | Wt 202.0 lb

## 2021-05-25 DIAGNOSIS — D509 Iron deficiency anemia, unspecified: Secondary | ICD-10-CM | POA: Diagnosis present

## 2021-05-25 DIAGNOSIS — D508 Other iron deficiency anemias: Secondary | ICD-10-CM

## 2021-05-25 MED ORDER — SODIUM CHLORIDE 0.9 % IV SOLN
Freq: Once | INTRAVENOUS | Status: AC
  Filled 2021-05-25: qty 250

## 2021-05-25 MED ORDER — IRON SUCROSE 20 MG/ML IV SOLN
200.0000 mg | Freq: Once | INTRAVENOUS | Status: AC
  Administered 2021-05-25: 200 mg via INTRAVENOUS
  Filled 2021-05-25: qty 10

## 2021-05-25 MED ORDER — SODIUM CHLORIDE 0.9 % IV SOLN: 200.0000 mg | Freq: Once | INTRAVENOUS | Status: DC

## 2021-05-25 NOTE — Patient Instructions (Signed)

## 2021-05-26 ENCOUNTER — Encounter: Payer: Self-pay | Admitting: Oncology

## 2021-05-31 ENCOUNTER — Inpatient Hospital Stay: Payer: No Typology Code available for payment source

## 2021-05-31 ENCOUNTER — Other Ambulatory Visit: Payer: Self-pay

## 2021-05-31 ENCOUNTER — Other Ambulatory Visit (HOSPITAL_COMMUNITY): Payer: Self-pay

## 2021-05-31 VITALS — BP 154/88 | HR 70 | Temp 99.3°F | Resp 18

## 2021-05-31 DIAGNOSIS — D508 Other iron deficiency anemias: Secondary | ICD-10-CM

## 2021-05-31 DIAGNOSIS — D509 Iron deficiency anemia, unspecified: Secondary | ICD-10-CM | POA: Diagnosis not present

## 2021-05-31 MED ORDER — SODIUM CHLORIDE 0.9 % IV SOLN: 200.0000 mg | Freq: Once | INTRAVENOUS | Status: DC

## 2021-05-31 MED ORDER — IRON SUCROSE 20 MG/ML IV SOLN
200.0000 mg | Freq: Once | INTRAVENOUS | Status: AC
  Administered 2021-05-31: 200 mg via INTRAVENOUS
  Filled 2021-05-31: qty 10

## 2021-05-31 MED ORDER — SODIUM CHLORIDE 0.9 % IV SOLN
INTRAVENOUS | Status: DC
  Filled 2021-05-31 (×2): qty 250

## 2021-06-03 ENCOUNTER — Inpatient Hospital Stay: Payer: No Typology Code available for payment source

## 2021-06-03 VITALS — BP 146/89 | HR 63 | Temp 97.6°F | Resp 18

## 2021-06-03 DIAGNOSIS — D509 Iron deficiency anemia, unspecified: Secondary | ICD-10-CM | POA: Diagnosis not present

## 2021-06-03 DIAGNOSIS — D508 Other iron deficiency anemias: Secondary | ICD-10-CM

## 2021-06-03 MED ORDER — SODIUM CHLORIDE 0.9 % IV SOLN
INTRAVENOUS | Status: DC
  Filled 2021-06-03: qty 250

## 2021-06-03 MED ORDER — IRON SUCROSE 20 MG/ML IV SOLN
200.0000 mg | Freq: Once | INTRAVENOUS | Status: AC
  Administered 2021-06-03: 200 mg via INTRAVENOUS
  Filled 2021-06-03: qty 10

## 2021-06-03 MED ORDER — SODIUM CHLORIDE 0.9 % IV SOLN: 200.0000 mg | Freq: Once | INTRAVENOUS | Status: DC

## 2021-06-03 NOTE — Patient Instructions (Signed)

## 2021-06-08 ENCOUNTER — Inpatient Hospital Stay: Payer: No Typology Code available for payment source | Attending: Oncology

## 2021-06-08 ENCOUNTER — Other Ambulatory Visit: Payer: Self-pay | Admitting: Nurse Practitioner

## 2021-06-08 VITALS — BP 140/84 | HR 79 | Temp 97.8°F | Resp 20

## 2021-06-08 DIAGNOSIS — R5383 Other fatigue: Secondary | ICD-10-CM

## 2021-06-08 DIAGNOSIS — D509 Iron deficiency anemia, unspecified: Secondary | ICD-10-CM | POA: Diagnosis present

## 2021-06-08 DIAGNOSIS — D508 Other iron deficiency anemias: Secondary | ICD-10-CM

## 2021-06-08 MED ORDER — SODIUM CHLORIDE 0.9 % IV SOLN
INTRAVENOUS | Status: DC
  Filled 2021-06-08: qty 250

## 2021-06-08 MED ORDER — IRON SUCROSE 20 MG/ML IV SOLN
200.0000 mg | Freq: Once | INTRAVENOUS | Status: AC
  Administered 2021-06-08: 200 mg via INTRAVENOUS
  Filled 2021-06-08: qty 10

## 2021-06-08 MED ORDER — SODIUM CHLORIDE 0.9 % IV SOLN: 200.0000 mg | Freq: Once | INTRAVENOUS | Status: DC

## 2021-06-08 NOTE — Progress Notes (Signed)
Spoke to patient in infusion. She has not noticed improvement in her symptoms with venofer. Also cites difficulty coming to appointments so frequently given time off from work (works as Health visitor at [employer]), distance traveling. Noticed improvement in symptoms and improved quality of life with feraheme which only requires 2 doses. Concerned of significant fatigue. Discussed adding lab encounter to check cbc and tsh next week when she comes for 5th venofer infusion. Will then plan to move up her appointments to 8 week follow-up/mid-may. She would like to change to feraheme if additional iron is needed and understands that she may have to pay out of pocket for this drug as it is not preferred by insurance. I have also encouraged her to reach out to her psychiatrist which she will do.  ?

## 2021-06-09 ENCOUNTER — Other Ambulatory Visit (HOSPITAL_COMMUNITY): Payer: Self-pay

## 2021-06-10 ENCOUNTER — Other Ambulatory Visit (HOSPITAL_COMMUNITY): Payer: Self-pay

## 2021-06-13 ENCOUNTER — Other Ambulatory Visit: Payer: Self-pay | Admitting: Nurse Practitioner

## 2021-06-13 ENCOUNTER — Inpatient Hospital Stay: Payer: No Typology Code available for payment source

## 2021-06-13 ENCOUNTER — Other Ambulatory Visit (HOSPITAL_COMMUNITY): Payer: Self-pay

## 2021-06-13 VITALS — BP 161/93 | HR 70 | Temp 97.3°F | Resp 18

## 2021-06-13 DIAGNOSIS — D509 Iron deficiency anemia, unspecified: Secondary | ICD-10-CM

## 2021-06-13 DIAGNOSIS — D508 Other iron deficiency anemias: Secondary | ICD-10-CM

## 2021-06-13 DIAGNOSIS — R5383 Other fatigue: Secondary | ICD-10-CM

## 2021-06-13 LAB — CBC WITH DIFFERENTIAL/PLATELET
Abs Immature Granulocytes: 0.04 10*3/uL (ref 0.00–0.07)
Basophils Absolute: 0.1 10*3/uL (ref 0.0–0.1)
Basophils Relative: 1 %
Eosinophils Absolute: 0.3 10*3/uL (ref 0.0–0.5)
Eosinophils Relative: 3 %
HCT: 38.5 % (ref 36.0–46.0)
Hemoglobin: 11.2 g/dL — ABNORMAL LOW (ref 12.0–15.0)
Immature Granulocytes: 0 %
Lymphocytes Relative: 19 %
Lymphs Abs: 1.9 10*3/uL (ref 0.7–4.0)
MCH: 22.4 pg — ABNORMAL LOW (ref 26.0–34.0)
MCHC: 29.1 g/dL — ABNORMAL LOW (ref 30.0–36.0)
MCV: 76.8 fL — ABNORMAL LOW (ref 80.0–100.0)
Monocytes Absolute: 0.6 10*3/uL (ref 0.1–1.0)
Monocytes Relative: 6 %
Neutro Abs: 7.2 10*3/uL (ref 1.7–7.7)
Neutrophils Relative %: 71 %
Platelets: 426 10*3/uL — ABNORMAL HIGH (ref 150–400)
RBC: 5.01 MIL/uL (ref 3.87–5.11)
Smear Review: NORMAL
WBC: 10.2 10*3/uL (ref 4.0–10.5)
nRBC: 0 % (ref 0.0–0.2)

## 2021-06-13 LAB — TSH: TSH: 2.471 u[IU]/mL (ref 0.350–4.500)

## 2021-06-13 MED ORDER — SODIUM CHLORIDE 0.9 % IV SOLN
INTRAVENOUS | Status: DC
  Filled 2021-06-13: qty 250

## 2021-06-13 MED ORDER — SODIUM CHLORIDE 0.9 % IV SOLN: 200.0000 mg | Freq: Once | INTRAVENOUS | Status: DC

## 2021-06-13 MED ORDER — IRON SUCROSE 20 MG/ML IV SOLN
200.0000 mg | Freq: Once | INTRAVENOUS | Status: AC
  Administered 2021-06-13: 200 mg via INTRAVENOUS
  Filled 2021-06-13: qty 10

## 2021-06-13 MED ORDER — ROPINIROLE HCL 2 MG PO TABS
4.0000 mg | ORAL_TABLET | Freq: Every day | ORAL | 1 refills | Status: DC
  Filled 2021-06-13: qty 60, 30d supply, fill #0
  Filled 2021-08-04: qty 60, 30d supply, fill #1

## 2021-06-13 NOTE — Progress Notes (Signed)
Feraheme added to treatment plan to allow for pre-auth from insurance/non-preferred. ?

## 2021-06-13 NOTE — Patient Instructions (Signed)

## 2021-06-14 ENCOUNTER — Other Ambulatory Visit: Payer: Self-pay | Admitting: Nurse Practitioner

## 2021-06-14 ENCOUNTER — Other Ambulatory Visit (HOSPITAL_COMMUNITY): Payer: Self-pay

## 2021-06-14 DIAGNOSIS — D509 Iron deficiency anemia, unspecified: Secondary | ICD-10-CM

## 2021-06-14 NOTE — Progress Notes (Signed)
Leah Herrera was authorized by insurance but approval only good through mid-May. Patient does not require additional IV iron at this time as she has just completed venofer infusion. Will plan to re-authorize feraheme for her for future treatments. I spoke to patient by phone and explained. Will also check b12 at next visit. Hemoglobin has responded to venofer well. TSH was normal.  ?

## 2021-06-15 ENCOUNTER — Other Ambulatory Visit (HOSPITAL_COMMUNITY): Payer: Self-pay

## 2021-06-17 ENCOUNTER — Other Ambulatory Visit (HOSPITAL_COMMUNITY): Payer: Self-pay

## 2021-06-17 MED ORDER — DEXTROAMPHETAMINE SULFATE 10 MG PO TABS
10.0000 mg | ORAL_TABLET | Freq: Every morning | ORAL | 0 refills | Status: DC
  Filled 2021-06-17: qty 30, 30d supply, fill #0

## 2021-06-17 MED ORDER — VYVANSE 60 MG PO CAPS
60.0000 mg | ORAL_CAPSULE | Freq: Every day | ORAL | 0 refills | Status: DC
  Filled 2021-06-17: qty 30, 30d supply, fill #0

## 2021-06-17 MED ORDER — PROPRANOLOL HCL 10 MG PO TABS
10.0000 mg | ORAL_TABLET | Freq: Three times a day (TID) | ORAL | 0 refills | Status: DC | PRN
  Filled 2021-06-17: qty 90, 30d supply, fill #0

## 2021-06-17 MED ORDER — ALPRAZOLAM 0.5 MG PO TABS
0.5000 mg | ORAL_TABLET | Freq: Every day | ORAL | 0 refills | Status: DC | PRN
  Filled 2021-06-17: qty 30, 30d supply, fill #0

## 2021-06-21 ENCOUNTER — Other Ambulatory Visit: Payer: Self-pay | Admitting: Nurse Practitioner

## 2021-06-21 DIAGNOSIS — D509 Iron deficiency anemia, unspecified: Secondary | ICD-10-CM

## 2021-06-22 ENCOUNTER — Other Ambulatory Visit (HOSPITAL_COMMUNITY): Payer: Self-pay

## 2021-06-23 ENCOUNTER — Other Ambulatory Visit (HOSPITAL_COMMUNITY): Payer: Self-pay

## 2021-06-23 MED ORDER — ESZOPICLONE 3 MG PO TABS
3.0000 mg | ORAL_TABLET | Freq: Every day | ORAL | 3 refills | Status: DC
  Filled 2021-06-23: qty 30, 30d supply, fill #0
  Filled 2021-08-04: qty 30, 30d supply, fill #1
  Filled 2021-09-28: qty 30, 30d supply, fill #2
  Filled 2021-11-20: qty 30, 30d supply, fill #3

## 2021-06-27 ENCOUNTER — Other Ambulatory Visit (HOSPITAL_COMMUNITY): Payer: Self-pay

## 2021-06-30 ENCOUNTER — Other Ambulatory Visit (HOSPITAL_COMMUNITY): Payer: Self-pay

## 2021-06-30 MED ORDER — HYDROXYZINE HCL 25 MG PO TABS
50.0000 mg | ORAL_TABLET | Freq: Every day | ORAL | 1 refills | Status: DC
  Filled 2021-06-30: qty 180, 90d supply, fill #0
  Filled 2021-09-28: qty 180, 90d supply, fill #1

## 2021-07-01 ENCOUNTER — Other Ambulatory Visit (HOSPITAL_COMMUNITY): Payer: Self-pay

## 2021-07-08 ENCOUNTER — Other Ambulatory Visit (HOSPITAL_COMMUNITY): Payer: Self-pay

## 2021-07-13 ENCOUNTER — Other Ambulatory Visit (HOSPITAL_COMMUNITY): Payer: Self-pay

## 2021-07-19 ENCOUNTER — Telehealth: Payer: Self-pay | Admitting: Medical Oncology

## 2021-07-19 NOTE — Telephone Encounter (Signed)
Called pt to R/s venofer inf to 6/6, lvm .Marland KitchenMarland KitchenKJ  ?

## 2021-07-20 ENCOUNTER — Encounter: Payer: Self-pay | Admitting: Nurse Practitioner

## 2021-07-22 ENCOUNTER — Inpatient Hospital Stay: Payer: No Typology Code available for payment source | Attending: Nurse Practitioner

## 2021-07-26 ENCOUNTER — Other Ambulatory Visit: Payer: Self-pay

## 2021-07-26 NOTE — Telephone Encounter (Signed)
RN called and spoke with patient.  RN to fax orders to patient work number she gave earlier per patient request.  Pt also asked about call she received about changing her appointment date.  RN  stated she would send message to scheduling team and someone would reach out to her. Pt verbalized understanding and stated she is available June 5 or August 09, 2021.

## 2021-07-28 ENCOUNTER — Other Ambulatory Visit (HOSPITAL_COMMUNITY)
Admission: RE | Admit: 2021-07-28 | Discharge: 2021-07-28 | Disposition: A | Discharge status: Home / Self Care | Payer: No Typology Code available for payment source | Source: Ambulatory Visit | Attending: Nurse Practitioner | Admitting: Nurse Practitioner

## 2021-07-28 DIAGNOSIS — D509 Iron deficiency anemia, unspecified: Secondary | ICD-10-CM | POA: Insufficient documentation

## 2021-07-28 LAB — IRON AND TIBC
Iron: 37 ug/dL (ref 28–170)
Saturation Ratios: 8 % — ABNORMAL LOW (ref 10.4–31.8)
TIBC: 465 ug/dL — ABNORMAL HIGH (ref 250–450)
UIBC: 428 ug/dL

## 2021-07-28 LAB — CBC WITH DIFFERENTIAL/PLATELET
Abs Immature Granulocytes: 0.04 10*3/uL (ref 0.00–0.07)
Basophils Absolute: 0.1 10*3/uL (ref 0.0–0.1)
Basophils Relative: 1 %
Eosinophils Absolute: 0.2 10*3/uL (ref 0.0–0.5)
Eosinophils Relative: 1 %
HCT: 41.6 % (ref 36.0–46.0)
Hemoglobin: 13.2 g/dL (ref 12.0–15.0)
Immature Granulocytes: 0 %
Lymphocytes Relative: 16 %
Lymphs Abs: 2 10*3/uL (ref 0.7–4.0)
MCH: 26.8 pg (ref 26.0–34.0)
MCHC: 31.7 g/dL (ref 30.0–36.0)
MCV: 84.6 fL (ref 80.0–100.0)
Monocytes Absolute: 0.6 10*3/uL (ref 0.1–1.0)
Monocytes Relative: 5 %
Neutro Abs: 9.6 10*3/uL — ABNORMAL HIGH (ref 1.7–7.7)
Neutrophils Relative %: 77 %
Platelets: 292 10*3/uL (ref 150–400)
RBC: 4.92 MIL/uL (ref 3.87–5.11)
RDW: 21.7 % — ABNORMAL HIGH (ref 11.5–15.5)
WBC: 12.5 10*3/uL — ABNORMAL HIGH (ref 4.0–10.5)
nRBC: 0 % (ref 0.0–0.2)

## 2021-07-28 LAB — COMPREHENSIVE METABOLIC PANEL
ALT: 22 U/L (ref 0–44)
AST: 19 U/L (ref 15–41)
Albumin: 4.2 g/dL (ref 3.5–5.0)
Alkaline Phosphatase: 68 U/L (ref 38–126)
Anion gap: 7 (ref 5–15)
BUN: 12 mg/dL (ref 6–20)
CO2: 21 mmol/L — ABNORMAL LOW (ref 22–32)
Calcium: 9.6 mg/dL (ref 8.9–10.3)
Chloride: 105 mmol/L (ref 98–111)
Creatinine, Ser: 1.05 mg/dL — ABNORMAL HIGH (ref 0.44–1.00)
GFR, Estimated: >60 mL/min (ref >60)
Glucose, Bld: 105 mg/dL — ABNORMAL HIGH (ref 70–99)
Potassium: 3.9 mmol/L (ref 3.5–5.1)
Sodium: 133 mmol/L — ABNORMAL LOW (ref 135–145)
Total Bilirubin: 0.3 mg/dL (ref 0.3–1.2)
Total Protein: 7.7 g/dL (ref 6.5–8.1)

## 2021-07-28 LAB — VITAMIN B12: Vitamin B-12: 258 pg/mL (ref 180–914)

## 2021-07-28 LAB — FERRITIN: Ferritin: 9 ng/mL — ABNORMAL LOW (ref 11–307)

## 2021-08-02 ENCOUNTER — Ambulatory Visit: Payer: No Typology Code available for payment source | Admitting: Nurse Practitioner

## 2021-08-02 ENCOUNTER — Ambulatory Visit: Payer: No Typology Code available for payment source

## 2021-08-04 ENCOUNTER — Other Ambulatory Visit (HOSPITAL_COMMUNITY): Payer: Self-pay

## 2021-08-08 ENCOUNTER — Inpatient Hospital Stay: Payer: No Typology Code available for payment source | Admitting: Nurse Practitioner

## 2021-08-08 ENCOUNTER — Inpatient Hospital Stay: Payer: No Typology Code available for payment source

## 2021-08-18 ENCOUNTER — Inpatient Hospital Stay: Payer: No Typology Code available for payment source | Admitting: Nurse Practitioner

## 2021-08-18 ENCOUNTER — Inpatient Hospital Stay: Payer: No Typology Code available for payment source

## 2021-08-22 ENCOUNTER — Inpatient Hospital Stay: Payer: No Typology Code available for payment source | Attending: Oncology | Admitting: Nurse Practitioner

## 2021-08-22 ENCOUNTER — Other Ambulatory Visit: Payer: Self-pay

## 2021-08-22 ENCOUNTER — Inpatient Hospital Stay: Payer: No Typology Code available for payment source

## 2021-08-22 VITALS — BP 160/85 | HR 63 | Temp 97.0°F | Resp 16 | Wt 189.0 lb

## 2021-08-22 DIAGNOSIS — N926 Irregular menstruation, unspecified: Secondary | ICD-10-CM

## 2021-08-22 DIAGNOSIS — Z1231 Encounter for screening mammogram for malignant neoplasm of breast: Secondary | ICD-10-CM

## 2021-08-22 DIAGNOSIS — D509 Iron deficiency anemia, unspecified: Secondary | ICD-10-CM | POA: Insufficient documentation

## 2021-08-22 DIAGNOSIS — D508 Other iron deficiency anemias: Secondary | ICD-10-CM

## 2021-08-22 DIAGNOSIS — E538 Deficiency of other specified B group vitamins: Secondary | ICD-10-CM | POA: Insufficient documentation

## 2021-08-22 MED ORDER — SODIUM CHLORIDE 0.9 % IV SOLN
510.0000 mg | Freq: Once | INTRAVENOUS | Status: AC
  Administered 2021-08-22: 510 mg via INTRAVENOUS
  Filled 2021-08-22: qty 17

## 2021-08-22 MED ORDER — SODIUM CHLORIDE 0.9 % IV SOLN
INTRAVENOUS | Status: DC
  Filled 2021-08-22: qty 250

## 2021-08-22 MED ORDER — CYANOCOBALAMIN 1000 MCG/ML IJ SOLN: 1000.0000 ug | INTRAMUSCULAR | 5 refills | Status: DC

## 2021-08-22 MED ORDER — "BD SAFETYGLIDE SYRINGE/NEEDLE 25G X 1"" 3 ML MISC": 5 refills | Status: DC

## 2021-08-22 NOTE — Progress Notes (Signed)
[place]  Telephone:([phone]) [phone] Fax:(336) [phone]  ID: Leah Herrera OB: 12-12-1975  MR#: [id number]  JKK#:[id number]  Patient Care Team: Harlan Stains, MD as PCP - General (Family Medicine) Harlan Stains, MD (Family Medicine)  CHIEF COMPLAINT: Iron deficiency anemia.  INTERVAL HISTORY: Patient is a 46 year old female with history of gastric bypass who returns to clinic for follow up for history of iron deficiency anemia. She previously was found to have iron deficiency and received venofer x 5. Fatigue is persistent and she states she has previously responded better to feraheme as opposed to venofer. She reports irregular menses. No blood in her stool. Has not had a colonoscopy or mammogram. Does not have a gyn. She otherwise feels well.  She has no neurologic complaints.  She denies any recent fevers or illnesses.  She has a good appetite and denies weight loss.  She has no chest pain, shortness of breath, cough, or hemoptysis.  She denies any nausea, vomiting, constipation, or diarrhea.  She has no melena or hematochezia.  She has no urinary complaints.  Patient otherwise feels well and offers no further specific complaints today.  REVIEW OF SYSTEMS:   Review of Systems  Constitutional:  Positive for malaise/fatigue. Negative for fever and weight loss.  Respiratory: Negative.  Negative for cough, hemoptysis and shortness of breath.   Cardiovascular: Negative.  Negative for chest pain and leg swelling.  Gastrointestinal: Negative.  Negative for abdominal pain, blood in stool and melena.  Genitourinary: Negative.  Negative for hematuria.  Musculoskeletal: Negative.  Negative for back pain.  Skin: Negative.  Negative for rash.  Neurological:  Negative for dizziness, focal weakness, weakness and headaches.  Psychiatric/Behavioral: Negative.  The patient is not nervous/anxious.   As per HPI. Otherwise, a complete review of systems is negative.  PAST MEDICAL  HISTORY: 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 ([place]) 03/31/2011   Anxiety    Arthritis    Cubital tunnel syndrome on right    Depression    OCD (obsessive compulsive disorder)    Vitamin D deficiency     PAST SURGICAL HISTORY: Past Surgical History:  Procedure Laterality Date   BACK SURGERY     CHOLECYSTECTOMY     ESOPHAGOGASTRODUODENOSCOPY (EGD) WITH PROPOFOL N/A 11/27/2014   Procedure: ESOPHAGOGASTRODUODENOSCOPY (EGD) WITH PROPOFOL;  Surgeon: Hulen Luster, MD;  Location: [facility] ENDOSCOPY;  Service: Gastroenterology;  Laterality: N/A;   ESOPHAGOGASTRODUODENOSCOPY (EGD) WITH PROPOFOL N/A 09/12/2017   Procedure: ESOPHAGOGASTRODUODENOSCOPY (EGD) WITH PROPOFOL;  Surgeon: Toledo, Benay Pike, MD;  Location: [facility] ENDOSCOPY;  Service: Gastroenterology;  Laterality: N/A;   gastric bipass     GASTRIC BYPASS  2008   ORIF TIBIA & FIBULA FRACTURES     right leg repaired     transposition and decompression Right elbow   ULNAR NERVE TRANSPOSITION Right 08/28/2014   Procedure: RIGHT ULNAR NERVE DECOMPRESSION/ AND ANTERIOR TRANSPOSITION;  Surgeon: Roseanne Kaufman, MD;  Location: [place];  Service: Orthopedics;  Laterality: Right;    FAMILY HISTORY: Family History  Problem Relation Age of Onset   Lung cancer Maternal Aunt    Lung cancer Maternal Grandmother    Lung cancer Maternal Aunt     ADVANCED DIRECTIVES (Y/N):  N  HEALTH MAINTENANCE: Social History   Tobacco Use   Smoking status: Every Day  Packs/day: 1.00    Types: Cigarettes   Smokeless tobacco: Never  Vaping Use   Vaping Use: Never used  Substance Use Topics   Alcohol use: No    Comment: social   Drug use: No    Colonoscopy:  PAP:  Bone density:  Lipid panel:  Allergies  Allergen Reactions   Penicillins Hives and Rash    Penicillins Anaphylaxis    Current Outpatient Medications  Medication Sig Dispense Refill   ALPRAZolam (XANAX) 0.5 MG tablet Take 1 tablet (0.5 mg total) by mouth one time as needed for anxiety. 30 tablet 2   buPROPion (WELLBUTRIN SR) 150 MG 12 hr tablet Take 150 mg by mouth daily.     dextroamphetamine (DEXTROSTAT) 10 MG tablet Take 1 tablet (10 mg total) by mouth every morning. 30 tablet 0   ergocalciferol (VITAMIN D2) 1.25 MG (50000 UT) capsule Take 1 capsule (50,000 Units total) by mouth once a week. 8 capsule 0   Eszopiclone 3 MG TABS Take 3 mg by mouth at bedtime as needed.     hydrOXYzine (ATARAX) 25 MG tablet Take 2 tablets (50 mg total) by mouth at bedtime for anxiety. 180 tablet 1   lisdexamfetamine (VYVANSE) 60 MG capsule Take 1 capsule (60 mg total) by mouth daily in the afternoon. 30 capsule 0   omeprazole (PRILOSEC) 40 MG capsule Take 1 capsule (40 mg total) by mouth 2 (two) times daily for GERD. 180 capsule 1   propranolol (INDERAL) 10 MG tablet Take 1 tablet (10 mg total) by mouth 3 (three) times daily as needed for tremors and anxiety 90 tablet 0   rOPINIRole (REQUIP) 2 MG tablet Take 2 tablets (4 mg) by mouth daily. Take 1 to 3 hours before bedtime for RLS. 60 tablet 1   sertraline (ZOLOFT) 100 MG tablet Take 100 mg by mouth at bedtime.     ALPRAZolam (XANAX) 0.5 MG tablet Take 1 tablet (0.5 mg total) by mouth daily as needed for anxiety 30 tablet 0   Eszopiclone 3 MG TABS Take 1 tablet (3 mg total) by mouth daily immediately before bedtime 30 tablet 3   hydrOXYzine (ATARAX/VISTARIL) 25 MG tablet Take 2 tablets (50 mg total) by mouth at bedtime. 180 tablet 5   omeprazole (PRILOSEC) 40 MG capsule Take 1 capsule (40 mg total) by mouth 2 (two) times daily for GERD 180 capsule 1   propranolol (INDERAL) 10 MG tablet Take 10 mg by mouth 3 (three) times daily as needed.     rOPINIRole (REQUIP) 2 MG tablet Take 1 tablet (2 mg total) by mouth 1-3 hours before bedtime. 30 tablet 3    topiramate (TOPAMAX) 100 MG tablet Take 1 tablet (100 mg total) by mouth at bedtime. (Patient not taking: Reported on 08/22/2021) 30 tablet 1   No current facility-administered medications for this visit.   Facility-Administered Medications Ordered in Other Visits  Medication Dose Route Frequency Provider Last Rate Last Admin   0.9 %  sodium chloride infusion   Intravenous Continuous Lloyd Huger, MD   Stopped at 09/20/17 1315    OBJECTIVE: Vitals:   08/22/21 1345  BP: (!) 160/85  Pulse: 63  Resp: 16  Temp: (!) 97 F (36.1 C)     Body mass index is 32.44 kg/m.    ECOG FS:1 - Symptomatic but completely ambulatory  General: Well-developed, well-nourished, no acute distress. Accompanied by husband Eyes: Pink conjunctiva, anicteric sclera. Lungs: Clear to auscultation bilaterally.  No audible wheezing or coughing Heart:  Regular rate and rhythm.  Abdomen: Soft, nontender, nondistended.  Musculoskeletal: No edema, cyanosis, or clubbing. Neuro: Alert, answering all questions appropriately. Cranial nerves grossly intact. Skin: No rashes or petechiae noted. Psych: Normal affect.   LAB RESULTS:  Lab Results  Component Value Date   NA 133 (L) 07/28/2021   K 3.9 07/28/2021   CL 105 07/28/2021   CO2 21 (L) 07/28/2021   GLUCOSE 105 (H) 07/28/2021   BUN 12 07/28/2021   CREATININE 1.05 (H) 07/28/2021   CALCIUM 9.6 07/28/2021   PROT 7.7 07/28/2021   ALBUMIN 4.2 07/28/2021   AST 19 07/28/2021   ALT 22 07/28/2021   ALKPHOS 68 07/28/2021   BILITOT 0.3 07/28/2021   GFRNONAA >60 07/28/2021   GFRAA >60 01/11/2019    Lab Results  Component Value Date   WBC 12.5 (H) 07/28/2021   NEUTROABS 9.6 (H) 07/28/2021   HGB 13.2 07/28/2021   HCT 41.6 07/28/2021   MCV 84.6 07/28/2021   PLT 292 07/28/2021   Lab Results  Component Value Date   IRON 37 07/28/2021   TIBC 465 (H) 07/28/2021   IRONPCTSAT 8 (L) 07/28/2021   Lab Results  Component Value Date   FERRITIN 9 (L) 07/28/2021      STUDIES: No results found.  ASSESSMENT: Iron deficiency anemia.  PLAN:    Iron deficiency anemia: hmg improved to 13.2. Ferritin 9, iron sat 8%. Improved but persistently low. Etiology unclear: possible absorption secondary to gastric bypass vs irregular/frequent menses vs others (see below). Proceed with feraheme weekly x 2. She will receive first dose today. B12 Deficiency- previously no response to oral b12. Recommend injections which she has tolerated previously. Patient prefers to do home injections. Prescription sent for 1000 mcg/1 ml injected into muscle once a month. Plan to check b12 levels at next visit. Goal b12 > 500.  Screening mammogram- patient has not had a mammogram. Agrees to scheduling today. Will order. Prefers to have performed at [facility].  Irregular/frequent menses- lifelong history of irregular menses but persistent. Question perimenopausal vs other etiologies. Recommend evaluation with gynecology. Patient agrees. Prefers [facility] gynecology. Will send referral.  Patient declines colonoscopy. Will discuss other colon cancer screening modalities with her pcp.   Disposition: Feraheme x 2 (first one today. Second in next week or 2) She will notify clinic prior to next visit so that lab orders may be faxed to her place of work/drawn there.  3 mo- see Wood[facility] Ganja or me, +/- feraheme Ref for mammogram (screening) Ref to gynecology (prefers [facility])- [place]  I spent a total of 30 minutes reviewing chart data, face-to-face evaluation with the patient, counseling and coordination of care as detailed above.  Patient expressed understanding and was in agreement with this plan. She also understands that She can call clinic at any time with any questions, concerns, or complaints.   Leah Au, NP  08/22/2021

## 2021-08-22 NOTE — Progress Notes (Signed)
Returns for follow-up. No new concerns.  

## 2021-08-22 NOTE — Patient Instructions (Signed)
[facility] CANCER CTR AT [place]  Discharge Instructions: Thank you for choosing [facility] to provide your oncology and hematology care.  If you have a lab appointment with the [facility], please go directly to the [facility] and check in at the registration area.  Wear comfortable clothing and clothing appropriate for easy access to any Portacath or PICC line.   We strive to give you quality time with your provider. You may need to reschedule your appointment if you arrive late (15 or more minutes).  Arriving late affects you and other patients whose appointments are after yours.  Also, if you miss three or more appointments without notifying the office, you may be dismissed from the clinic at the provider's discretion.      For prescription refill requests, have your pharmacy contact our office and allow 72 hours for refills to be completed.    Today you received the following chemotherapy and/or immunotherapy agents FERAHEME       To help prevent nausea and vomiting after your treatment, we encourage you to take your nausea medication as directed.  BELOW ARE SYMPTOMS THAT SHOULD BE REPORTED IMMEDIATELY: *FEVER GREATER THAN 100.4 F (38 C) OR HIGHER *CHILLS OR SWEATING *NAUSEA AND VOMITING THAT IS NOT CONTROLLED WITH YOUR NAUSEA MEDICATION *UNUSUAL SHORTNESS OF BREATH *UNUSUAL BRUISING OR BLEEDING *URINARY PROBLEMS (pain or burning when urinating, or frequent urination) *BOWEL PROBLEMS (unusual diarrhea, constipation, pain near the anus) TENDERNESS IN MOUTH AND THROAT WITH OR WITHOUT PRESENCE OF ULCERS (sore throat, sores in mouth, or a toothache) UNUSUAL RASH, SWELLING OR PAIN  UNUSUAL VAGINAL DISCHARGE OR ITCHING   Items with * indicate a potential emergency and should be followed up as soon as possible or go to the Emergency Department if any problems should occur.  Please show the CHEMOTHERAPY ALERT CARD or IMMUNOTHERAPY ALERT CARD at check-in to  the Emergency Department and triage nurse.  Should you have questions after your visit or need to cancel or reschedule your appointment, please contact [facility] CANCER [place] AT [facility]  [phone] and follow the prompts.  Office hours are 8:00 a.m. to 4:30 p.m. Monday - Friday. Please note that voicemails left after 4:00 p.m. may not be returned until the following business day.  We are closed weekends and major holidays. You have access to a nurse at all times for urgent questions. Please call the main number to the clinic [phone] and follow the prompts.  For any non-urgent questions, you may also contact your provider using MyChart. We now offer e-Visits for anyone 26 and older to request care online for non-urgent symptoms. For details visit [email or web address].   Also download the MyChart app! Go to the app store, search "MyChart", open the app, select [facility], and log in with your MyChart username and password.  Masks are optional in the cancer centers. If you would like for your care team to wear a mask while they are taking care of you, please let them know. For doctor visits, patients may have with them one support person who is at least 48 years old. At this time, visitors are not allowed in the infusion area.

## 2021-08-24 ENCOUNTER — Other Ambulatory Visit (HOSPITAL_COMMUNITY): Payer: Self-pay

## 2021-08-24 ENCOUNTER — Other Ambulatory Visit: Payer: No Typology Code available for payment source

## 2021-08-25 ENCOUNTER — Ambulatory Visit: Payer: No Typology Code available for payment source

## 2021-08-25 ENCOUNTER — Ambulatory Visit: Payer: No Typology Code available for payment source | Admitting: Nurse Practitioner

## 2021-08-26 MED FILL — Ferumoxytol Inj 510 MG/17ML (30 MG/ML) (Elemental Fe): INTRAVENOUS | Qty: 17 | Status: AC

## 2021-08-29 ENCOUNTER — Inpatient Hospital Stay: Payer: No Typology Code available for payment source

## 2021-08-31 ENCOUNTER — Other Ambulatory Visit (HOSPITAL_COMMUNITY): Payer: Self-pay

## 2021-08-31 MED ORDER — DEXTROAMPHETAMINE SULFATE 10 MG PO TABS
10.0000 mg | ORAL_TABLET | Freq: Every morning | ORAL | 0 refills | Status: DC
  Filled 2021-08-31: qty 30, 30d supply, fill #0

## 2021-08-31 MED ORDER — VYVANSE 60 MG PO CAPS
60.0000 mg | ORAL_CAPSULE | Freq: Every day | ORAL | 0 refills | Status: DC
  Filled 2021-08-31: qty 30, 30d supply, fill #0

## 2021-09-01 ENCOUNTER — Other Ambulatory Visit (HOSPITAL_COMMUNITY): Payer: Self-pay

## 2021-09-01 MED ORDER — ERGOCALCIFEROL 1.25 MG (50000 UT) PO CAPS
50000.0000 [IU] | ORAL_CAPSULE | ORAL | 3 refills | Status: DC
  Filled 2021-09-01: qty 8, 56d supply, fill #0
  Filled 2021-11-20: qty 8, 56d supply, fill #1

## 2021-09-01 MED ORDER — ROPINIROLE HCL 2 MG PO TABS
4.0000 mg | ORAL_TABLET | Freq: Every day | ORAL | 1 refills | Status: DC
  Filled 2021-09-01: qty 60, 30d supply, fill #0
  Filled 2021-09-28 – 2021-12-30 (×3): qty 60, 30d supply, fill #1

## 2021-09-01 MED ORDER — PROPRANOLOL HCL 10 MG PO TABS
10.0000 mg | ORAL_TABLET | Freq: Three times a day (TID) | ORAL | 0 refills | Status: DC | PRN
  Filled 2021-09-01: qty 90, 30d supply, fill #0

## 2021-09-01 MED ORDER — BUPROPION HCL ER (XL) 300 MG PO TB24
300.0000 mg | ORAL_TABLET | Freq: Every morning | ORAL | 1 refills | Status: DC
  Filled 2021-09-01: qty 90, 90d supply, fill #0
  Filled 2021-09-28 – 2021-11-20 (×2): qty 90, 90d supply, fill #1

## 2021-09-28 ENCOUNTER — Other Ambulatory Visit (HOSPITAL_COMMUNITY): Payer: Self-pay

## 2021-09-29 ENCOUNTER — Other Ambulatory Visit (HOSPITAL_COMMUNITY): Payer: Self-pay

## 2021-09-30 ENCOUNTER — Other Ambulatory Visit (HOSPITAL_COMMUNITY): Payer: Self-pay

## 2021-10-03 ENCOUNTER — Other Ambulatory Visit (HOSPITAL_COMMUNITY): Payer: Self-pay

## 2021-10-04 ENCOUNTER — Other Ambulatory Visit (HOSPITAL_COMMUNITY): Payer: Self-pay

## 2021-10-06 ENCOUNTER — Other Ambulatory Visit (HOSPITAL_COMMUNITY): Payer: Self-pay

## 2021-10-06 MED ORDER — HYDROXYZINE HCL 25 MG PO TABS
50.0000 mg | ORAL_TABLET | Freq: Every evening | ORAL | 1 refills | Status: DC
  Filled 2021-11-20 – 2021-12-30 (×3): qty 180, 90d supply, fill #0

## 2021-10-06 MED ORDER — VYVANSE 60 MG PO CAPS
60.0000 mg | ORAL_CAPSULE | Freq: Every day | ORAL | 0 refills | Status: DC
  Filled 2021-10-06: qty 30, 30d supply, fill #0

## 2021-10-06 MED ORDER — ESZOPICLONE 3 MG PO TABS
3.0000 mg | ORAL_TABLET | Freq: Every day | ORAL | 3 refills | Status: DC
  Filled 2021-12-30 (×2): qty 30, 30d supply, fill #0

## 2021-10-06 MED ORDER — DEXTROAMPHETAMINE SULFATE 10 MG PO TABS
10.0000 mg | ORAL_TABLET | Freq: Every morning | ORAL | 0 refills | Status: DC
  Filled 2021-10-06: qty 30, 30d supply, fill #0

## 2021-10-06 MED ORDER — ROPINIROLE HCL 2 MG PO TABS
4.0000 mg | ORAL_TABLET | Freq: Every day | ORAL | 1 refills | Status: DC
Start: 2021-10-06 — End: 2022-04-29
  Filled 2021-11-20 – 2021-12-30 (×3): qty 60, 30d supply, fill #0

## 2021-10-11 ENCOUNTER — Other Ambulatory Visit (HOSPITAL_COMMUNITY): Payer: Self-pay

## 2021-10-31 ENCOUNTER — Telehealth: Payer: No Typology Code available for payment source | Admitting: Family

## 2021-10-31 DIAGNOSIS — J209 Acute bronchitis, unspecified: Secondary | ICD-10-CM

## 2021-10-31 MED ORDER — PREDNISONE 10 MG (21) PO TBPK: ORAL_TABLET | ORAL | 0 refills | Status: DC

## 2021-10-31 MED ORDER — BENZONATATE 100 MG PO CAPS: 100.0000 mg | ORAL_CAPSULE | Freq: Three times a day (TID) | ORAL | 0 refills | Status: DC | PRN

## 2021-10-31 MED ORDER — ALBUTEROL SULFATE HFA 108 (90 BASE) MCG/ACT IN AERS: 2.0000 | INHALATION_SPRAY | Freq: Four times a day (QID) | RESPIRATORY_TRACT | 0 refills | Status: AC | PRN

## 2021-10-31 NOTE — Progress Notes (Signed)
We are sorry that you are not feeling well.  Here is how we plan to help!  Based on your presentation I believe you most likely have A cough due to a virus.  This is called viral bronchitis and is best treated by rest, plenty of fluids and control of the cough.  You may use Ibuprofen or Tylenol as directed to help your symptoms.     In addition you may use A non-prescription cough medication called Robitussin DAC. Take 2 teaspoons every 8 hours or Delsym: take 2 teaspoons every 12 hours., A non-prescription cough medication called Mucinex DM: take 2 tablets every 12 hours., and A prescription cough medication called Tessalon Perles '100mg'$ . You may take 1-2 capsules every 8 hours as needed for your cough. I have also refilled your albuterol inhaler.   Prednisone 10 mg daily for 6 days (see taper instructions below)  Directions for 6 day taper: Day 1: 2 tablets before breakfast, 1 after both lunch & dinner and 2 at bedtime Day 2: 1 tab before breakfast, 1 after both lunch & dinner and 2 at bedtime Day 3: 1 tab at each meal & 1 at bedtime Day 4: 1 tab at breakfast, 1 at lunch, 1 at bedtime Day 5: 1 tab at breakfast & 1 tab at bedtime Day 6: 1 tab at breakfast  From your responses in the eVisit questionnaire you describe inflammation in the upper respiratory tract which is causing a significant cough.  This is commonly called Bronchitis and has four common causes:   Allergies Viral Infections Acid Reflux Bacterial Infection Allergies, viruses and acid reflux are treated by controlling symptoms or eliminating the cause. An example might be a cough caused by taking certain blood pressure medications. You stop the cough by changing the medication. Another example might be a cough caused by acid reflux. Controlling the reflux helps control the cough.  USE OF BRONCHODILATOR ("RESCUE") INHALERS: There is a risk from using your bronchodilator too frequently.  The risk is that over-reliance on a  medication which only relaxes the muscles surrounding the breathing tubes can reduce the effectiveness of medications prescribed to reduce swelling and congestion of the tubes themselves.  Although you feel brief relief from the bronchodilator inhaler, your asthma may actually be worsening with the tubes becoming more swollen and filled with mucus.  This can delay other crucial treatments, such as oral steroid medications. If you need to use a bronchodilator inhaler daily, several times per day, you should discuss this with your provider.  There are probably better treatments that could be used to keep your asthma under control.     HOME CARE Only take medications as instructed by your medical team. Complete the entire course of an antibiotic. Drink plenty of fluids and get plenty of rest. Avoid close contacts especially the very young and the elderly Cover your mouth if you cough or cough into your sleeve. Always remember to wash your hands A steam or ultrasonic humidifier can help congestion.   GET HELP RIGHT AWAY IF: You develop worsening fever. You become short of breath You cough up blood. Your symptoms persist after you have completed your treatment plan MAKE SURE YOU  Understand these instructions. Will watch your condition. Will get help right away if you are not doing well or get worse.    Thank you for choosing an e-visit.  Your e-visit answers were reviewed by a board certified advanced clinical practitioner to complete your personal care plan. Depending upon  the condition, your plan could have included both over the counter or prescription medications.  Please review your pharmacy choice. Make sure the pharmacy is open so you can pick up prescription now. If there is a problem, you may contact your provider through [employer] and have the prescription routed to another pharmacy.  Your safety is important to [place]. If you have drug allergies check your prescription carefully.    For the next 24 hours you can use MyChart to ask questions about today's visit, request a non-urgent call back, or ask for a work or school excuse. You will get an email in the next two days asking about your experience. I hope that your e-visit has been valuable and will speed your recovery.  Approximately 5 minutes was spent documenting and reviewing patient's chart.

## 2021-11-01 ENCOUNTER — Other Ambulatory Visit: Payer: Self-pay

## 2021-11-01 DIAGNOSIS — D508 Other iron deficiency anemias: Secondary | ICD-10-CM

## 2021-11-02 ENCOUNTER — Other Ambulatory Visit: Payer: Self-pay

## 2021-11-02 DIAGNOSIS — D508 Other iron deficiency anemias: Secondary | ICD-10-CM

## 2021-11-06 ENCOUNTER — Emergency Department (HOSPITAL_COMMUNITY): Payer: No Typology Code available for payment source

## 2021-11-06 ENCOUNTER — Emergency Department (HOSPITAL_COMMUNITY)
Admission: EM | Admit: 2021-11-06 | Discharge: 2021-11-06 | Disposition: A | Discharge status: Home / Self Care | Payer: No Typology Code available for payment source | Attending: Emergency Medicine | Admitting: Emergency Medicine

## 2021-11-06 ENCOUNTER — Encounter (HOSPITAL_COMMUNITY): Payer: Self-pay | Admitting: *Deleted

## 2021-11-06 ENCOUNTER — Other Ambulatory Visit: Payer: Self-pay

## 2021-11-06 DIAGNOSIS — R61 Generalized hyperhidrosis: Secondary | ICD-10-CM | POA: Insufficient documentation

## 2021-11-06 DIAGNOSIS — D72829 Elevated white blood cell count, unspecified: Secondary | ICD-10-CM | POA: Diagnosis not present

## 2021-11-06 DIAGNOSIS — R0789 Other chest pain: Secondary | ICD-10-CM | POA: Diagnosis not present

## 2021-11-06 DIAGNOSIS — R0602 Shortness of breath: Secondary | ICD-10-CM | POA: Diagnosis not present

## 2021-11-06 LAB — CBC WITH DIFFERENTIAL/PLATELET
Abs Immature Granulocytes: 0.16 10*3/uL — ABNORMAL HIGH (ref 0.00–0.07)
Basophils Absolute: 0.1 10*3/uL (ref 0.0–0.1)
Basophils Relative: 1 %
Eosinophils Absolute: 0.3 10*3/uL (ref 0.0–0.5)
Eosinophils Relative: 3 %
HCT: 43.5 % (ref 36.0–46.0)
Hemoglobin: 14.3 g/dL (ref 12.0–15.0)
Immature Granulocytes: 1 %
Lymphocytes Relative: 18 %
Lymphs Abs: 2 10*3/uL (ref 0.7–4.0)
MCH: 31.2 pg (ref 26.0–34.0)
MCHC: 32.9 g/dL (ref 30.0–36.0)
MCV: 95 fL (ref 80.0–100.0)
Monocytes Absolute: 0.6 10*3/uL (ref 0.1–1.0)
Monocytes Relative: 5 %
Neutro Abs: 8.1 10*3/uL — ABNORMAL HIGH (ref 1.7–7.7)
Neutrophils Relative %: 72 %
Platelets: 381 10*3/uL (ref 150–400)
RBC: 4.58 MIL/uL (ref 3.87–5.11)
RDW: 15.9 % — ABNORMAL HIGH (ref 11.5–15.5)
WBC: 11.3 10*3/uL — ABNORMAL HIGH (ref 4.0–10.5)
nRBC: 0 % (ref 0.0–0.2)

## 2021-11-06 LAB — COMPREHENSIVE METABOLIC PANEL
ALT: 37 U/L (ref 0–44)
AST: 20 U/L (ref 15–41)
Albumin: 3.5 g/dL (ref 3.5–5.0)
Alkaline Phosphatase: 86 U/L (ref 38–126)
Anion gap: 8 (ref 5–15)
BUN: 12 mg/dL (ref 6–20)
CO2: 26 mmol/L (ref 22–32)
Calcium: 9 mg/dL (ref 8.9–10.3)
Chloride: 102 mmol/L (ref 98–111)
Creatinine, Ser: 0.92 mg/dL (ref 0.44–1.00)
GFR, Estimated: >60 mL/min (ref >60)
Glucose, Bld: 94 mg/dL (ref 70–99)
Potassium: 3.9 mmol/L (ref 3.5–5.1)
Sodium: 136 mmol/L (ref 135–145)
Total Bilirubin: 0.5 mg/dL (ref 0.3–1.2)
Total Protein: 7.2 g/dL (ref 6.5–8.1)

## 2021-11-06 LAB — IRON AND TIBC
Iron: 50 ug/dL (ref 28–170)
Saturation Ratios: 14 % (ref 10.4–31.8)
TIBC: 350 ug/dL (ref 250–450)
UIBC: 300 ug/dL

## 2021-11-06 LAB — FERRITIN: Ferritin: 33 ng/mL (ref 11–307)

## 2021-11-06 LAB — TROPONIN I (HIGH SENSITIVITY)
Troponin I (High Sensitivity): 3 ng/L (ref <18)
Troponin I (High Sensitivity): 5 ng/L (ref <18)

## 2021-11-06 LAB — LIPASE, BLOOD: Lipase: 35 U/L (ref 11–51)

## 2021-11-06 LAB — HCG, QUANTITATIVE, PREGNANCY: hCG, Beta Chain, Quant, S: 5 m[IU]/mL — ABNORMAL HIGH (ref <5)

## 2021-11-06 MED ORDER — OXYCODONE-ACETAMINOPHEN 5-325 MG PO TABS: 1.0000 | ORAL_TABLET | Freq: Four times a day (QID) | ORAL | 0 refills | Status: AC | PRN

## 2021-11-06 MED ORDER — KETOROLAC TROMETHAMINE 15 MG/ML IJ SOLN
15.0000 mg | Freq: Once | INTRAMUSCULAR | Status: AC
  Administered 2021-11-06: 15 mg via INTRAVENOUS
  Filled 2021-11-06: qty 1

## 2021-11-06 MED ORDER — MORPHINE SULFATE (PF) 2 MG/ML IV SOLN
2.0000 mg | Freq: Once | INTRAVENOUS | Status: AC
  Administered 2021-11-06: 2 mg via INTRAVENOUS
  Filled 2021-11-06: qty 1

## 2021-11-06 MED ORDER — ALUM & MAG HYDROXIDE-SIMETH 200-200-20 MG/5ML PO SUSP
30.0000 mL | Freq: Once | ORAL | Status: AC
  Administered 2021-11-06: 30 mL via ORAL
  Filled 2021-11-06: qty 30

## 2021-11-06 NOTE — ED Triage Notes (Signed)
Pt with left sided sharp cp for 2 weeks off and on, with SOB at times.  Denies any N/V or dizziness. Took '324mg'$  ASA PTA today, woke pt up from sleep today

## 2021-11-06 NOTE — Discharge Instructions (Signed)
You were seen in the emergency department today for chest pain.  Your work-up here has been reassuring.  Please follow-up with your primary care provider.  If your symptoms worsen you may return to the emergency department.  I have given you a prescription of Percocet that he can use for significant pain.  You have been prescribed a medication that is considered an opiate. Opiates are pain medications that should be used with caution. It is important that you do not drive while taking this medication as it can cause drowsiness and impaired reaction times. Do not mix this medication with benzodiazepine medications or alcohol as this can cause respiratory depression. Additionally, opiates have addicting properties to them. Please use medication as prescribed by your provider.

## 2021-11-06 NOTE — ED Provider Notes (Signed)
[facility] EMERGENCY DEPARTMENT Provider Note   CSN: [id number] Arrival date & time: 11/06/21  1757     History  Chief Complaint  Patient presents with   Chest Pain    Leah Herrera is a 46 y.o. female. With past medical history of arthritis, ADHD, IDA who presents to the emergency department with chest pain.  States she has had intermittent chest pain over the past 2-3 weeks. She describes having sharp, left sided chest pain that radiates to her left shoulder and left scapula. Has had associated diaphoresis with this previously. No vomiting. She does feel short of breath intermittently with the chest pain. Denies palpitations. States that she was taking a nap today, when the pain woke her up out of sleep which concerned her. Denies trauma to the chest. Denies syncope. Denies cough or fever, recent long flights or drives, immobilization, cancer.    Chest Pain Associated symptoms: diaphoresis and shortness of breath   Associated symptoms: no cough, no nausea and no palpitations        Home Medications Prior to Admission medications   Medication Sig Start Date End Date Taking? Authorizing Provider  oxyCODONE-acetaminophen (PERCOCET/ROXICET) 5-325 MG tablet Take 1 tablet by mouth every 6 (six) hours as needed for up to 3 days for severe pain. 11/06/21 11/09/21 Yes Mickie Hillier, PA-C  albuterol (VENTOLIN HFA) 108 (90 Base) MCG/ACT inhaler Inhale 2 puffs into the lungs every 6 (six) hours as needed for wheezing or shortness of breath. 10/31/21   Sharion Balloon, FNP  ALPRAZolam Duanne Moron) 0.5 MG tablet Take 1 tablet (0.5 mg total) by mouth one time as needed for anxiety. 10/14/20     ALPRAZolam (XANAX) 0.5 MG tablet Take 1 tablet (0.5 mg total) by mouth daily as needed for anxiety 06/16/21     benzonatate (TESSALON PERLES) 100 MG capsule Take 1 capsule (100 mg total) by mouth 3 (three) times daily as needed. 10/31/21   Sharion Balloon, FNP  buPROPion (WELLBUTRIN SR) 150 MG 12 hr tablet Take  150 mg by mouth daily. 12/10/18   [provider]  buPROPion (WELLBUTRIN XL) 300 MG 24 hr tablet Take 1 tablet (300 mg total) by mouth in the morning. 08/31/21     cyanocobalamin (,VITAMIN B-12,) 1000 MCG/ML injection Inject 1 mL (1,000 mcg total) into the muscle every 30 (thirty) days. 08/22/21   Verlon Au, NP  dextroamphetamine (DEXTROSTAT) 10 MG tablet Take 1 tablet (10 mg total) by mouth every morning. 10/06/21     ergocalciferol (VITAMIN D2) 1.25 MG (50000 UT) capsule Take 1 capsule (50,000 Units total) by mouth once a week. 05/05/21     ergocalciferol (VITAMIN D2) 1.25 MG (50000 UT) capsule Take 1 capsule (50,000 Units total) by mouth once a week. 08/31/21     Eszopiclone 3 MG TABS Take 3 mg by mouth at bedtime as needed. 12/12/18   [provider]  Eszopiclone 3 MG TABS Take 1 tablet (3 mg total) by mouth daily immediately before bedtime 06/23/21     Eszopiclone 3 MG TABS Take 1 tablet (3 mg total) by mouth daily immediately before bedtime 10/06/21     hydrOXYzine (ATARAX) 25 MG tablet Take 2 tablets (50 mg total) by mouth at bedtime for anxiety 10/06/21     hydrOXYzine (ATARAX/VISTARIL) 25 MG tablet Take 2 tablets (50 mg total) by mouth at bedtime. 05/11/20     lisdexamfetamine (VYVANSE) 60 MG capsule Take 1 capsule (60 mg total) by mouth daily in  the afternoon. 10/06/21     omeprazole (PRILOSEC) 40 MG capsule Take 1 capsule (40 mg total) by mouth 2 (two) times daily for GERD. 10/14/20     omeprazole (PRILOSEC) 40 MG capsule Take 1 capsule (40 mg total) by mouth 2 (two) times daily for GERD 03/31/21     predniSONE (STERAPRED UNI-PAK 21 TAB) 10 MG (21) TBPK tablet Use as directed 10/31/21   Evelina Dun A, FNP  propranolol (INDERAL) 10 MG tablet Take 10 mg by mouth 3 (three) times daily as needed. 12/12/18   [provider]  propranolol (INDERAL) 10 MG tablet Take 1 tablet (10 mg total) by mouth 3 (three) times daily as needed for tremors and anxiety 08/31/21     rOPINIRole  (REQUIP) 2 MG tablet Take 1 tablet (2 mg total) by mouth 1-3 hours before bedtime. 01/10/21     rOPINIRole (REQUIP) 2 MG tablet Take 2 tablets (4 mg) by mouth daily. Take 1 to 3 hours before bedtime for RLS. 06/13/21     rOPINIRole (REQUIP) 2 MG tablet Take 2 tablets (4 mg total) by mouth1 to 3 hours before bedtime for RLS 08/31/21     rOPINIRole (REQUIP) 2 MG tablet Take 2 tablets (4 mg total) by mouth daily, 1-3 hours before bedtime for RLS 10/06/21     sertraline (ZOLOFT) 100 MG tablet Take 100 mg by mouth at bedtime. 12/10/18   [provider]  sulfamethoxazole-trimethoprim (BACTRIM DS) 800-160 MG tablet Take 1 tablet by mouth 2 (two) times daily. 07/26/21   [provider]  SYRINGE-NEEDLE, DISP, 3 ML (BD SAFETYGLIDE SYRINGE/NEEDLE) 25G X 1" 3 ML MISC Use the needle for intramuscular injection of B12 once a month. 08/22/21   Verlon Au, NP  topiramate (TOPAMAX) 100 MG tablet Take 1 tablet (100 mg total) by mouth at bedtime. Patient not taking: Reported on 08/22/2021 08/16/20 10/11/21        Allergies    Penicillins and Penicillins    Review of Systems   Review of Systems  Constitutional:  Positive for diaphoresis.  Respiratory:  Positive for shortness of breath. Negative for cough.   Cardiovascular:  Positive for chest pain. Negative for palpitations.  Gastrointestinal:  Negative for nausea.  All other systems reviewed and are negative.   Physical Exam Updated Vital Signs BP (!) 145/96   Pulse 70   Temp 98.1 F (36.7 C)   Resp 20   Ht '5\' 4"'$  (1.626 m)   Wt 86.2 kg   LMP  (LMP Unknown)   SpO2 98%   BMI 32.61 kg/m  Physical Exam Vitals and nursing note reviewed.  Constitutional:      General: She is not in acute distress.    Appearance: She is well-developed. She is obese. She is ill-appearing. She is not toxic-appearing.  HENT:     Head: Normocephalic and atraumatic.     Mouth/Throat:     Mouth: Mucous membranes are moist.     Pharynx: Oropharynx is clear.   Eyes:     General: No scleral icterus.    Extraocular Movements: Extraocular movements intact.     Pupils: Pupils are equal, round, and reactive to light.  Cardiovascular:     Rate and Rhythm: Normal rate.     Pulses:          Radial pulses are 2+ on the right side and 2+ on the left side.     Heart sounds: Normal heart sounds. No murmur heard. Pulmonary:  Effort: Pulmonary effort is normal. No tachypnea or respiratory distress.  Chest:     Chest wall: No tenderness.  Abdominal:     General: There is no distension.     Palpations: Abdomen is soft.  Musculoskeletal:        General: Normal range of motion.     Cervical back: Normal range of motion and neck supple.  Skin:    General: Skin is warm and dry.     Capillary Refill: Capillary refill takes less than 2 seconds.     Findings: No rash.  Neurological:     General: No focal deficit present.     Mental Status: She is alert and oriented to person, place, and time. Mental status is at baseline.  Psychiatric:        Mood and Affect: Mood normal.        Behavior: Behavior normal.        Thought Content: Thought content normal.        Judgment: Judgment normal.     ED Results / Procedures / Treatments   Labs (all labs ordered are listed, but only abnormal results are displayed) Labs Reviewed  CBC WITH DIFFERENTIAL/PLATELET - Abnormal; Notable for the following components:      Result Value   WBC 11.3 (*)    RDW 15.9 (*)    Neutro Abs 8.1 (*)    Abs Immature Granulocytes 0.16 (*)    All other components within normal limits  HCG, QUANTITATIVE, PREGNANCY - Abnormal; Notable for the following components:   hCG, Beta Chain, Quant, S 5 (*)    All other components within normal limits  COMPREHENSIVE METABOLIC PANEL  FERRITIN  IRON AND TIBC  LIPASE, BLOOD  TROPONIN I (HIGH SENSITIVITY)  TROPONIN I (HIGH SENSITIVITY)   EKG EKG Interpretation  Date/Time:  Sunday November 06 2021 18:04:39 EDT Ventricular Rate:   79 PR Interval:  135 QRS Duration: 88 QT Interval:  372 QTC Calculation: 427 R Axis:   75 Text Interpretation: Sinus rhythm Confirmed by Davonna Belling [phone]) on 11/06/2021 6:28:55 PM  Radiology DG Chest 2 View  Result Date: 11/06/2021 CLINICAL DATA:  Chest pain EXAM: CHEST - 2 VIEW COMPARISON:  11/12/2017 FINDINGS: The heart size and mediastinal contours are within normal limits. Both lungs are clear. The surgical fusion in the lower cervical spine. IMPRESSION: No active cardiopulmonary disease. Electronically Signed   By: Elmer Picker M.D.   On: 11/06/2021 18:37    Procedures Procedures   Medications Ordered in ED Medications  ketorolac (TORADOL) 15 MG/ML injection 15 mg (15 mg Intravenous Given 11/06/21 1911)  alum & mag hydroxide-simeth (MAALOX/MYLANTA) 200-200-20 MG/5ML suspension 30 mL (30 mLs Oral Given 11/06/21 1936)  morphine (PF) 2 MG/ML injection 2 mg (2 mg Intravenous Given 11/06/21 2018)    ED Course/ Medical Decision Making/ A&P                           Medical Decision Making Amount and/or Complexity of Data Reviewed Labs: ordered. Radiology: ordered.  Risk OTC drugs. Prescription drug management.  This patient presents to the ED with chief complaint(s) of chest pain with pertinent past medical history of ADHD, IDA which further complicates the presenting complaint. The complaint involves an extensive differential diagnosis and also carries with it a high risk of complications and morbidity.    The differential diagnosis includes Acute chest syndrome, stable angina, atypical angina, pulmonary embolism, pneumothorax, aortic dissection, pleural  effusion, CHF, COPD, asthma, myocarditis, pericarditis, cardiac tamponade, chest wall pain    Additional history obtained: Additional history obtained from family Records reviewed Care Everywhere/External Records  ED Course and Reassessment: 46 year old female who presents to the emergency department with chest  pain. Her exam is without evidence of fluid volume overload and no history of heart failure so doubt this is her etiology.  EKG without ischemia or infarction, troponin x2 negative so doubt ACS. Her presentation is not consistent with an acute PE.  She is well's low risk so will not pursue D-dimer or CT PE study.  Her chest x-ray does not demonstrate any pneumothorax, cardiomegaly, pneumonia. No recent infections, troponin negative so doubt pericarditis, myocarditis.  Symptoms are inconsistent with dissection or tamponade. HEART Score: 1   There was concern that perhaps her chest pain was radiation from the abdomen.  Her labs are unremarkable however.  Lipase is negative.  She was given a GI cocktail with no real improvement in symptoms.  We then tried Toradol which also did not improve symptoms.  She was given 2 mg of morphine which blunted her pain.  We will prescribe her a very short course of opioid pain relief over the next few days.  Given strict return precautions for worsening symptoms.  Otherwise feel that she is safe for primary care follow-up.  Independent labs interpretation:  The following labs were independently interpreted:  CMP negative CBC mild leukocytosis to 11.3, likely reactive Hcg negative Troponin x2 negative I paced negative  Independent visualization of imaging: - I independently visualized the following imaging with scope of interpretation limited to determining acute life threatening conditions related to emergency care: CXR, which revealed no acute findings  Consultation: - Consulted or discussed management/test interpretation w/ external professional: Not indicated  Consideration for admission or further workup: Not indicated Social Determinants of health: None identified Final Clinical Impression(s) / ED Diagnoses Final diagnoses:  Atypical chest pain    Rx / DC Orders ED Discharge Orders          Ordered    oxyCODONE-acetaminophen (PERCOCET/ROXICET)  5-325 MG tablet  Every 6 hours PRN        11/06/21 2120              Mickie Hillier, PA-C 11/07/21 1631    Davonna Belling, MD 11/10/21 2333

## 2021-11-14 ENCOUNTER — Other Ambulatory Visit (HOSPITAL_COMMUNITY): Payer: Self-pay

## 2021-11-14 MED ORDER — DEXTROAMPHETAMINE SULFATE 10 MG PO TABS
10.0000 mg | ORAL_TABLET | Freq: Every morning | ORAL | 0 refills | Status: DC
  Filled 2021-11-14: qty 30, 30d supply, fill #0

## 2021-11-14 MED ORDER — OMEPRAZOLE 40 MG PO CPDR
40.0000 mg | DELAYED_RELEASE_CAPSULE | Freq: Two times a day (BID) | ORAL | 1 refills | Status: DC
  Filled 2021-11-14 – 2021-12-30 (×3): qty 180, 90d supply, fill #0

## 2021-11-14 MED ORDER — TIZANIDINE HCL 4 MG PO TABS
ORAL_TABLET | ORAL | 0 refills | Status: DC
  Filled 2021-11-14: qty 120, 20d supply, fill #0

## 2021-11-14 MED ORDER — LISDEXAMFETAMINE DIMESYLATE 60 MG PO CAPS
60.0000 mg | ORAL_CAPSULE | Freq: Every day | ORAL | 0 refills | Status: DC
  Filled 2021-11-14: qty 30, 30d supply, fill #0

## 2021-11-14 MED ORDER — ALPRAZOLAM 0.5 MG PO TABS
0.5000 mg | ORAL_TABLET | Freq: Every day | ORAL | 0 refills | Status: DC | PRN
  Filled 2021-11-14: qty 30, 30d supply, fill #0

## 2021-11-14 MED ORDER — PROPRANOLOL HCL 10 MG PO TABS
20.0000 mg | ORAL_TABLET | Freq: Two times a day (BID) | ORAL | 1 refills | Status: DC | PRN
  Filled 2021-11-14: qty 120, 30d supply, fill #0

## 2021-11-15 ENCOUNTER — Other Ambulatory Visit (HOSPITAL_COMMUNITY): Payer: Self-pay

## 2021-11-17 ENCOUNTER — Other Ambulatory Visit: Payer: No Typology Code available for payment source

## 2021-11-21 ENCOUNTER — Other Ambulatory Visit (HOSPITAL_COMMUNITY): Payer: Self-pay

## 2021-11-22 ENCOUNTER — Ambulatory Visit: Payer: No Typology Code available for payment source | Admitting: Nurse Practitioner

## 2021-11-22 ENCOUNTER — Ambulatory Visit: Payer: No Typology Code available for payment source

## 2021-11-23 MED FILL — Ferumoxytol Inj 510 MG/17ML (30 MG/ML) (Elemental Fe): INTRAVENOUS | Qty: 17 | Status: AC

## 2021-11-24 ENCOUNTER — Inpatient Hospital Stay: Payer: No Typology Code available for payment source

## 2021-11-24 ENCOUNTER — Inpatient Hospital Stay: Payer: No Typology Code available for payment source | Attending: Nurse Practitioner | Admitting: Nurse Practitioner

## 2021-12-30 ENCOUNTER — Other Ambulatory Visit (HOSPITAL_COMMUNITY): Payer: Self-pay

## 2022-01-23 ENCOUNTER — Other Ambulatory Visit (HOSPITAL_COMMUNITY): Payer: Self-pay

## 2022-01-30 ENCOUNTER — Encounter: Payer: Self-pay | Admitting: Oncology

## 2022-01-31 ENCOUNTER — Encounter: Payer: Self-pay | Admitting: Oncology

## 2022-01-31 ENCOUNTER — Encounter: Payer: Self-pay | Admitting: Orthopaedic Surgery

## 2022-01-31 ENCOUNTER — Ambulatory Visit (INDEPENDENT_AMBULATORY_CARE_PROVIDER_SITE_OTHER): Payer: BC Managed Care – PPO | Admitting: Orthopaedic Surgery

## 2022-01-31 ENCOUNTER — Ambulatory Visit: Payer: No Typology Code available for payment source | Admitting: Orthopaedic Surgery

## 2022-01-31 ENCOUNTER — Ambulatory Visit (INDEPENDENT_AMBULATORY_CARE_PROVIDER_SITE_OTHER): Payer: BC Managed Care – PPO

## 2022-01-31 VITALS — BP 163/112 | HR 83 | Ht 64.0 in | Wt 190.0 lb

## 2022-01-31 DIAGNOSIS — M25571 Pain in right ankle and joints of right foot: Secondary | ICD-10-CM

## 2022-01-31 MED ORDER — ONDANSETRON HCL 4 MG PO TABS: 4.0000 mg | ORAL_TABLET | Freq: Three times a day (TID) | ORAL | 0 refills | Status: DC | PRN

## 2022-01-31 MED ORDER — HYDROCODONE-ACETAMINOPHEN 5-325 MG PO TABS: 1.0000 | ORAL_TABLET | Freq: Four times a day (QID) | ORAL | 0 refills | Status: DC | PRN

## 2022-01-31 NOTE — Progress Notes (Signed)
Office Visit Note   Patient: Leah Herrera           Date of Birth: 19-Sep-1975           MRN: [id number] Visit Date: 01/31/2022              Requested by: Harlan Stains, MD [place],  [place] [address] PCP: Harlan Stains, MD   Assessment & Plan: Visit Diagnoses:  1. Pain in right ankle and joints of right foot     Plan: Patient was only in a stirrup splint.  After splint removal posterior portion added.  Return 1 week for splint removal skin check and likely short leg fiberglass cast.  Work slip given for okay for work as long she is nonweightbearing with the right lower extremity.  Norco sent in that she only use at night.  She can use ibuprofen when she is working and also sent in a prescription for Zofran at her request.  Recheck 1 week.  Follow-Up Instructions: No follow-ups on file.   Orders:  Orders Placed This Encounter  Procedures   XR Ankle Complete Right   Meds ordered this encounter  Medications   HYDROcodone-acetaminophen (NORCO/VICODIN) 5-325 MG tablet    Sig: Take 1 tablet by mouth every 6 (six) hours as needed for moderate pain.    Dispense:  30 tablet    Refill:  0    Acute ankle fracture   ondansetron (ZOFRAN) 4 MG tablet    Sig: Take 1 tablet (4 mg total) by mouth every 8 (eight) hours as needed for nausea or vomiting.    Dispense:  30 tablet    Refill:  0      Procedures: No procedures performed   Clinical Data: No additional findings.   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  She is placed in a well-padded splint has been trying to keep it elevated.  She got a knee roller which she has been using.  She states her job states she could  come to work in a nonweightbearing position.  Review of Systems past history of tibia fracture distal third treated with interlocking nail still present on the right.  All the systems noncontributory to HPI.   Objective: Vital Signs: BP (!) 163/112   Pulse 83   Ht '5\' 4"'$  (1.626 m)   Wt 190 lb (86.2 kg)   BMI 32.61 kg/m   Physical Exam Constitutional:      Appearance: She is well-developed.  HENT:     Head: Normocephalic.     Right Ear: External ear normal.     Left Ear: External ear normal. There is no impacted cerumen.  Eyes:     Pupils: Pupils are equal, round, and reactive to light.  Neck:     Thyroid: No thyromegaly.     Trachea: No tracheal deviation.  Cardiovascular:     Rate and Rhythm: Normal rate.  Pulmonary:     Effort: Pulmonary effort is normal.  Abdominal:     Palpations: Abdomen is soft.  Musculoskeletal:     Cervical back: No rigidity.  Skin:    General: Skin is warm and dry.  Neurological:     Mental Status: She is alert and oriented to person, place, and time.  Psychiatric:        Behavior: Behavior normal.     Ortho Exam well-padded short leg splint.  Splint removed no fracture blisters.  Sensation is intact to the toes.  No posterior nodes well is because it is broken and is swollen and hurts but right that will help thanks  Specialty Comments:  No specialty comments available.  Imaging: XR Ankle Complete Right  Result Date: 01/31/2022 Three-view x-rays right ankle demonstrates essentially nondisplaced Weber B distal fibular fracture.  No widening of the medial clear space.  Previous healed oblique fracture distal third of the tibia with interlocking nail present. Impression: Right lateral malleolar fracture essentially nondisplaced.    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   Bilious vomiting with  nausea 11/13/2017   Gastric outlet obstruction 11/13/2017   Gastric bypass status for obesity 11/13/2017   Iron deficiency anemia 07/11/2014   Chronic LBP 10/20/2011   Cervical pain 10/20/2011   Neck pain 10/20/2011   Chronic low back pain 10/20/2011   Anemia, iron deficiency 03/31/2011   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 ([place]) 03/31/2011   Anxiety    Arthritis    Cubital tunnel syndrome on right    Depression    OCD (obsessive compulsive disorder)    Vitamin D deficiency     Family History  Problem Relation Age of Onset   Lung cancer Maternal Aunt    Lung cancer Maternal Grandmother    Lung cancer Maternal Aunt     Past Surgical History:  Procedure Laterality Date   BACK SURGERY     CHOLECYSTECTOMY     ESOPHAGOGASTRODUODENOSCOPY (EGD) WITH PROPOFOL N/A 11/27/2014   Procedure: ESOPHAGOGASTRODUODENOSCOPY (EGD) WITH PROPOFOL;  Surgeon: Hulen Luster, MD;  Location[facility] ENDOSCOPY;  Service: Gastroenterology;  Laterality: N/A;   ESOPHAGOGASTRODUODENOSCOPY (EGD) WITH PROPOFOL N/A 09/12/2017   Procedure: ESOPHAGOGASTRODUODENOSCOPY (EGD) WITH PROPOFOL;  Surgeon: Toledo, Benay Pike, MD;  Location: [facility] ENDOSCOPY;  Service: Gastroenterology;  Laterality: N/A;   gastric bipass     GASTRIC BYPASS  2008   ORIF TIBIA & FIBULA FRACTURES     right leg repaired     transposition and decompression Right elbow   ULNAR NERVE TRANSPOSITION Right 08/28/2014   Procedure: RIGHT ULNAR NERVE DECOMPRESSION/ AND ANTERIOR TRANSPOSITION;  Surgeon: Roseanne Kaufman, MD;  Location: [place];  Service: Orthopedics;  Laterality: Right;   Social History   Occupational History   Not on file  Tobacco Use   Smoking status: Every Day    Packs/day: 1.00    Types: Cigarettes   Smokeless tobacco: Never   Vaping Use   Vaping Use: Never used  Substance and Sexual Activity   Alcohol use: No    Comment: social   Drug use: No   Sexual activity: Yes

## 2022-02-09 ENCOUNTER — Encounter: Payer: Self-pay | Admitting: Orthopaedic Surgery

## 2022-02-09 ENCOUNTER — Ambulatory Visit (INDEPENDENT_AMBULATORY_CARE_PROVIDER_SITE_OTHER): Payer: BC Managed Care – PPO | Admitting: Orthopaedic Surgery

## 2022-02-09 ENCOUNTER — Ambulatory Visit: Payer: Self-pay

## 2022-02-09 VITALS — Ht 64.0 in | Wt 190.0 lb

## 2022-02-09 DIAGNOSIS — S8261XA Displaced fracture of lateral malleolus of right fibula, initial encounter for closed fracture: Secondary | ICD-10-CM | POA: Diagnosis not present

## 2022-02-09 DIAGNOSIS — M25571 Pain in right ankle and joints of right foot: Secondary | ICD-10-CM | POA: Diagnosis not present

## 2022-02-09 MED ORDER — HYDROCODONE-ACETAMINOPHEN 5-325 MG PO TABS: 1.0000 | ORAL_TABLET | Freq: Four times a day (QID) | ORAL | 0 refills | Status: DC | PRN

## 2022-02-09 NOTE — Progress Notes (Signed)
Office Visit Note   Patient: Leah Herrera           Date of Birth: Oct 19, 1975           MRN: [id number] Visit Date: 02/09/2022              Requested by: Harlan Stains, MD [address] [place],  [place] [address] PCP: Harlan Stains, MD   Assessment & Plan: Visit Diagnoses: Lateral malleolar 1. Pain in right ankle and joints of right foot     Plan: Short leg fiberglass cast applied.  Follow-up after new years beginning of January for cast off and three-view x-rays right ankle.  We likely will place her in a cam boot at that point.  Follow-Up Instructions: No follow-ups on file.   Orders:  Orders Placed This Encounter  Procedures   XR Ankle Complete Right   Meds ordered this encounter  Medications   HYDROcodone-acetaminophen (NORCO/VICODIN) 5-325 MG tablet    Sig: Take 1 tablet by mouth every 6 (six) hours as needed for moderate pain.    Dispense:  30 tablet    Refill:  0    Acute ankle fracture      Procedures: No procedures performed   Clinical Data: No additional findings.   Subjective: Chief Complaint  Patient presents with   Right Ankle - Follow-up, Fracture    DOI 01/25/2022    HPI 46 year old female follow-up lateral malleolar fracture treated conservatively.  She has had several falls since last seen she needs more pain medication hydrocodone renewed.  Splint removed short leg fiberglass cast applied and repeat x-rays obtained.  This shows nondisplaced lateral malleolar fracture without widening of the medial clear space.  Previous interlocking tibial nail again noted.  Tibia was not injured with her fall.  Review of Systems unchanged   Objective: Vital Signs: Ht '5\' 4"'$  (1.626 m)   Wt 190 lb (86.2 kg)   BMI 32.61 kg/m   Physical Exam Constitutional:      Appearance: She is well-developed.  HENT:     Head: Normocephalic.     Right Ear: External ear normal.     Left Ear: External ear normal. There is no impacted cerumen.   Eyes:     Pupils: Pupils are equal, round, and reactive to light.  Neck:     Thyroid: No thyromegaly.     Trachea: No tracheal deviation.  Cardiovascular:     Rate and Rhythm: Normal rate.  Pulmonary:     Effort: Pulmonary effort is normal.  Abdominal:     Palpations: Abdomen is soft.  Musculoskeletal:     Cervical back: No rigidity.  Skin:    General: Skin is warm and dry.  Neurological:     Mental Status: She is alert and oriented to person, place, and time.  Psychiatric:        Behavior: Behavior normal.     Ortho Exam swelling is down to her ankle Ecchymosis.  Some ecchymosis laterally adjacent to the calcaneus on the lateral side and tenderness to the distal fibula over the lateral malleolus as expected.  Good capillary refill.  Specialty Comments:  No specialty comments available.  Imaging: No results found.   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   Bilious vomiting with nausea 11/13/2017   Gastric outlet obstruction 11/13/2017   Gastric bypass status  for obesity 11/13/2017   Iron deficiency anemia 07/11/2014   Chronic LBP 10/20/2011   Cervical pain 10/20/2011   Neck pain 10/20/2011   Chronic low back pain 10/20/2011   Anemia, iron deficiency 03/31/2011   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 ([place]) 03/31/2011   Anxiety    Arthritis    Cubital tunnel syndrome on right    Depression    OCD (obsessive compulsive disorder)    Vitamin D deficiency     Family History  Problem Relation Age of Onset   Lung cancer Maternal Aunt    Lung cancer Maternal Grandmother    Lung cancer Maternal Aunt     Past Surgical History:  Procedure  Laterality Date   BACK SURGERY     CHOLECYSTECTOMY     ESOPHAGOGASTRODUODENOSCOPY (EGD) WITH PROPOFOL N/A 11/27/2014   Procedure: ESOPHAGOGASTRODUODENOSCOPY (EGD) WITH PROPOFOL;  Surgeon: Hulen Luster, MD;  Location: [facility];  Service: Gastroenterology;  Laterality: N/A;   ESOPHAGOGASTRODUODENOSCOPY (EGD) WITH PROPOFOL N/A 09/12/2017   Procedure: ESOPHAGOGASTRODUODENOSCOPY (EGD) WITH PROPOFOL;  Surgeon: Toledo, Benay Pike, MD;  Location: [facility];  Service: Gastroenterology;  Laterality: N/A;   gastric bipass     GASTRIC BYPASS  2008   ORIF TIBIA & FIBULA FRACTURES     right leg repaired     transposition and decompression Right elbow   ULNAR NERVE TRANSPOSITION Right 08/28/2014   Procedure: RIGHT ULNAR NERVE DECOMPRESSION/ AND ANTERIOR TRANSPOSITION;  Surgeon: Roseanne Kaufman, MD;  Location: [facility];  Service: Orthopedics;  Laterality: Right;   Social History   Occupational History   Not on file  Tobacco Use   Smoking status: Every Day    Packs/day: 1.00    Types: Cigarettes   Smokeless tobacco: Never  Vaping Use   Vaping Use: Never used  Substance and Sexual Activity   Alcohol use: No    Comment: social   Drug use: No   Sexual activity: Yes

## 2022-03-09 ENCOUNTER — Ambulatory Visit (INDEPENDENT_AMBULATORY_CARE_PROVIDER_SITE_OTHER): Payer: BC Managed Care – PPO

## 2022-03-09 ENCOUNTER — Ambulatory Visit (INDEPENDENT_AMBULATORY_CARE_PROVIDER_SITE_OTHER): Payer: BC Managed Care – PPO | Admitting: Orthopaedic Surgery

## 2022-03-09 ENCOUNTER — Encounter: Payer: Self-pay | Admitting: Orthopaedic Surgery

## 2022-03-09 VITALS — Ht 64.0 in | Wt 190.0 lb

## 2022-03-09 DIAGNOSIS — S8261XA Displaced fracture of lateral malleolus of right fibula, initial encounter for closed fracture: Secondary | ICD-10-CM

## 2022-03-09 NOTE — Progress Notes (Signed)
Office Visit Note   Patient: Leah Herrera           Date of Birth: [birth year]           MRN: [id number] Visit Date: 03/09/2022              Requested by: Harlan Stains, MD [address] [place],  [place] [address] PCP: Harlan Stains, MD   Assessment & Plan: Visit Diagnoses:  1. Displaced fracture of lateral malleolus of right fibula, initial encounter for closed fracture     Plan: Cam boot weightbearing as tolerated progression.  Three-view x-rays right ankle on return in 5 weeks.  Follow-Up Instructions: No follow-ups on file.   Orders:  Orders Placed This Encounter  Procedures   XR Ankle Complete Right   No orders of the defined types were placed in this encounter.     Procedures: No procedures performed   Clinical Data: No additional findings.   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  X-rays today look good interval healing.  She is placed in a cam boot can weight-bear as tolerated.  She is still working.  Return 5 weeks final x-rays right ankle on return.  Review of Systems updated unchanged   Objective: Vital Signs: Ht '5\' 4"'$  (1.626 m)   Wt 190 lb (86.2 kg)   BMI 32.61 kg/m   Physical Exam Constitutional:      Appearance: She is well-developed.  HENT:     Head: Normocephalic.     Right Ear: External ear normal.     Left Ear: External ear normal. There is no impacted cerumen.  Eyes:     Pupils: Pupils are equal, round, and reactive to light.  Neck:     Thyroid: No thyromegaly.     Trachea: No tracheal deviation.  Cardiovascular:     Rate and Rhythm: Normal rate.  Pulmonary:     Effort: Pulmonary effort is normal.  Abdominal:     Palpations: Abdomen is  soft.  Musculoskeletal:     Cervical back: No rigidity.  Skin:    General: Skin is warm and dry.  Neurological:     Mental Status: She is alert and oriented to person, place, and time.  Psychiatric:        Behavior: Behavior normal.     Ortho Exam palpable callus over the lateral malleolus 2 to 3 cm from the tip.  No tenderness at the fracture site.  Skin is normal.  Specialty Comments:  No specialty comments available.  Imaging: No results found.   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   Bilious vomiting with nausea 11/13/2017   Gastric outlet obstruction 11/13/2017   Gastric bypass status for obesity 11/13/2017   Iron deficiency anemia 07/11/2014   Chronic LBP 10/20/2011   Cervical pain 10/20/2011   Neck pain 10/20/2011   Chronic low back pain 10/20/2011   Anemia, iron deficiency 03/31/2011   Past Medical History:  Diagnosis Date   ADHD (attention deficit hyperactivity disorder)    Anemia    chronic anemia, recieves iron infusions periodically   Anemia  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 ([facility]) 03/31/2011   Anxiety    Arthritis    Cubital tunnel syndrome on right    Depression    OCD (obsessive compulsive disorder)    Vitamin D deficiency     Family History  Problem Relation Age of Onset   Lung cancer Maternal Aunt    Lung cancer Maternal Grandmother    Lung cancer Maternal Aunt     Past Surgical History:  Procedure Laterality Date   BACK SURGERY     CHOLECYSTECTOMY     ESOPHAGOGASTRODUODENOSCOPY (EGD) WITH PROPOFOL N/A 11/27/2014   Procedure: ESOPHAGOGASTRODUODENOSCOPY (EGD) WITH PROPOFOL;  Surgeon: Hulen Luster, MD;  Location: [facility];  Service: Gastroenterology;  Laterality: N/A;   ESOPHAGOGASTRODUODENOSCOPY (EGD) WITH PROPOFOL N/A 09/12/2017   Procedure: ESOPHAGOGASTRODUODENOSCOPY  (EGD) WITH PROPOFOL;  Surgeon: Toledo, Benay Pike, MD;  Location: [facility];  Service: Gastroenterology;  Laterality: N/A;   gastric bipass     GASTRIC BYPASS  2008   ORIF TIBIA & FIBULA FRACTURES     right leg repaired     transposition and decompression Right elbow   ULNAR NERVE TRANSPOSITION Right 08/28/2014   Procedure: RIGHT ULNAR NERVE DECOMPRESSION/ AND ANTERIOR TRANSPOSITION;  Surgeon: Roseanne Kaufman, MD;  Location: [facility];  Service: Orthopedics;  Laterality: Right;   Social History   Occupational History   Not on file  Tobacco Use   Smoking status: Every Day    Packs/day: 1.00    Types: Cigarettes   Smokeless tobacco: Never  Vaping Use   Vaping Use: Never used  Substance and Sexual Activity   Alcohol use: No    Comment: social   Drug use: No   Sexual activity: Yes

## 2022-04-11 ENCOUNTER — Emergency Department (HOSPITAL_BASED_OUTPATIENT_CLINIC_OR_DEPARTMENT_OTHER): Payer: BC Managed Care – PPO | Admitting: Radiology

## 2022-04-11 ENCOUNTER — Encounter (HOSPITAL_BASED_OUTPATIENT_CLINIC_OR_DEPARTMENT_OTHER): Payer: Self-pay

## 2022-04-11 ENCOUNTER — Emergency Department (HOSPITAL_BASED_OUTPATIENT_CLINIC_OR_DEPARTMENT_OTHER)
Admission: EM | Admit: 2022-04-11 | Discharge: 2022-04-11 | Disposition: A | Discharge status: Home / Self Care | Payer: BC Managed Care – PPO | Attending: Emergency Medicine | Admitting: Emergency Medicine

## 2022-04-11 ENCOUNTER — Other Ambulatory Visit: Payer: Self-pay

## 2022-04-11 DIAGNOSIS — Z20822 Contact with and (suspected) exposure to covid-19: Secondary | ICD-10-CM | POA: Diagnosis not present

## 2022-04-11 DIAGNOSIS — J168 Pneumonia due to other specified infectious organisms: Secondary | ICD-10-CM | POA: Diagnosis not present

## 2022-04-11 DIAGNOSIS — J189 Pneumonia, unspecified organism: Secondary | ICD-10-CM

## 2022-04-11 DIAGNOSIS — R059 Cough, unspecified: Secondary | ICD-10-CM | POA: Diagnosis not present

## 2022-04-11 DIAGNOSIS — J90 Pleural effusion, not elsewhere classified: Secondary | ICD-10-CM | POA: Diagnosis not present

## 2022-04-11 DIAGNOSIS — R0682 Tachypnea, not elsewhere classified: Secondary | ICD-10-CM

## 2022-04-11 DIAGNOSIS — R0789 Other chest pain: Secondary | ICD-10-CM | POA: Diagnosis not present

## 2022-04-11 LAB — CBC
HCT: 44 % (ref 36.0–46.0)
Hemoglobin: 15 g/dL (ref 12.0–15.0)
MCH: 30.2 pg (ref 26.0–34.0)
MCHC: 34.1 g/dL (ref 30.0–36.0)
MCV: 88.7 fL (ref 80.0–100.0)
Platelets: 378 10*3/uL (ref 150–400)
RBC: 4.96 MIL/uL (ref 3.87–5.11)
RDW: 13.5 % (ref 11.5–15.5)
WBC: 23.5 10*3/uL — ABNORMAL HIGH (ref 4.0–10.5)
nRBC: 0 % (ref 0.0–0.2)

## 2022-04-11 LAB — RESP PANEL BY RT-PCR (RSV, FLU A&B, COVID)  RVPGX2
Influenza A by PCR: NEGATIVE
Influenza B by PCR: NEGATIVE
Resp Syncytial Virus by PCR: NEGATIVE
SARS Coronavirus 2 by RT PCR: NEGATIVE

## 2022-04-11 LAB — BASIC METABOLIC PANEL
Anion gap: 13 (ref 5–15)
BUN: 15 mg/dL (ref 6–20)
CO2: 22 mmol/L (ref 22–32)
Calcium: 10.3 mg/dL (ref 8.9–10.3)
Chloride: 97 mmol/L — ABNORMAL LOW (ref 98–111)
Creatinine, Ser: 0.98 mg/dL (ref 0.44–1.00)
GFR, Estimated: >60 mL/min (ref >60)
Glucose, Bld: 124 mg/dL — ABNORMAL HIGH (ref 70–99)
Potassium: 3.7 mmol/L (ref 3.5–5.1)
Sodium: 132 mmol/L — ABNORMAL LOW (ref 135–145)

## 2022-04-11 LAB — TROPONIN I (HIGH SENSITIVITY): Troponin I (High Sensitivity): 5 ng/L (ref <18)

## 2022-04-11 MED ORDER — LEVOFLOXACIN IN D5W 750 MG/150ML IV SOLN
750.0000 mg | Freq: Once | INTRAVENOUS | Status: AC
  Administered 2022-04-11: 750 mg via INTRAVENOUS
  Filled 2022-04-11: qty 150

## 2022-04-11 MED ORDER — LEVOFLOXACIN 750 MG PO TABS: 750.0000 mg | ORAL_TABLET | Freq: Every day | ORAL | 0 refills | Status: AC

## 2022-04-11 NOTE — Discharge Instructions (Addendum)
Your x-ray showed that you have pneumonia in both lungs.  Your blood test also show that your white blood cell count is elevated, which could be a sign of a bacterial infection.  We discussed the options of medical admission and observation in the hospital, versus treatment at home, and you preferred to go home.  You were given antibiotics in the ER today.  Your next dose of antibiotic would be tomorrow morning.  This prescription was sent to your pharmacy.  If your work of breathing is getting worse, or if you having difficulty speaking in full sentences, or begin to feel lightheaded, or have loss of consciousness or confusion, please return immediately to the hospital.

## 2022-04-11 NOTE — ED Notes (Signed)
EDP into room, at BS.  ?

## 2022-04-11 NOTE — ED Provider Notes (Signed)
[facility] Provider Note   CSN: [id number] Arrival date & time: 04/11/22  7035     History  Chief Complaint  Patient presents with   Pleurisy    Leah Herrera is a 47 y.o. female presenting to the emergency department with complaint of productive cough for 3 days.  Patient reports this began Friday.  She was started on clindamycin at that time for a dental tooth abscess and has been taking it for 3 days.  She says it hurts when she takes a deep breath in, and it feels like "the pneumonia I had in the past".  She has had pneumonia once.  She denies any other medical complaints or any other medical history.  She does not use breathing treatments at home.  She does smoke but denies history of COPD.  She states she took a negative home COVID test  She feels that her chest pain symptoms are improved when she is laying flat, and worse when she is sitting up.  HPI     Home Medications Prior to Admission medications   Medication Sig Start Date End Date Taking? Authorizing Provider  levofloxacin (LEVAQUIN) 750 MG tablet Take 1 tablet (750 mg total) by mouth daily for 5 days. 04/12/22 04/17/22 Yes Yichen Gilardi, Carola Rhine, MD  albuterol (VENTOLIN HFA) 108 (90 Base) MCG/ACT inhaler Inhale 2 puffs into the lungs every 6 (six) hours as needed for wheezing or shortness of breath. 10/31/21   Sharion Balloon, FNP  ALPRAZolam Duanne Moron) 0.5 MG tablet Take 1 tablet (0.5 mg total) by mouth one time as needed for anxiety. 10/14/20     ALPRAZolam (XANAX) 0.5 MG tablet Take 1 tablet (0.5 mg total) by mouth daily as needed for anxiety 06/16/21     ALPRAZolam (XANAX) 0.5 MG tablet Take 1 tablet (0.5 mg total) by mouth one time as needed for anxiety 11/14/21     benzonatate (TESSALON PERLES) 100 MG capsule Take 1 capsule (100 mg total) by mouth 3 (three) times daily as needed. 10/31/21   Sharion Balloon, FNP  buPROPion (WELLBUTRIN SR) 150 MG 12 hr tablet Take 150 mg by mouth  daily. 12/10/18   [provider]  buPROPion (WELLBUTRIN XL) 300 MG 24 hr tablet Take 1 tablet (300 mg total) by mouth in the morning. 08/31/21     cyanocobalamin (,VITAMIN B-12,) 1000 MCG/ML injection Inject 1 mL (1,000 mcg total) into the muscle every 30 (thirty) days. 08/22/21   Verlon Au, NP  dextroamphetamine (DEXTROSTAT) 10 MG tablet Take 1 tablet (10 mg total) by mouth every morning. 11/14/21     ergocalciferol (VITAMIN D2) 1.25 MG (50000 UT) capsule Take 1 capsule (50,000 Units total) by mouth once a week. 05/05/21     ergocalciferol (VITAMIN D2) 1.25 MG (50000 UT) capsule Take 1 capsule (50,000 Units total) by mouth once a week. 08/31/21     Eszopiclone 3 MG TABS Take 3 mg by mouth at bedtime as needed. 12/12/18   [provider]  Eszopiclone 3 MG TABS Take 1 tablet (3 mg total) by mouth daily immediately before bedtime 06/23/21     Eszopiclone 3 MG TABS Take 1 tablet (3 mg total) by mouth daily immediately before bedtime 10/06/21     HYDROcodone-acetaminophen (NORCO/VICODIN) 5-325 MG tablet Take 1 tablet by mouth every 6 (six) hours as needed for moderate pain. 02/09/22   Marybelle Killings, MD  hydrOXYzine (ATARAX) 25 MG tablet Take 2 tablets (50 mg  total) by mouth at bedtime for anxiety 10/06/21     hydrOXYzine (ATARAX/VISTARIL) 25 MG tablet Take 2 tablets (50 mg total) by mouth at bedtime. 05/11/20     lisdexamfetamine (VYVANSE) 60 MG capsule Take 1 capsule (60 mg total) by mouth daily in the afternoon. 11/14/21     omeprazole (PRILOSEC) 40 MG capsule Take 1 capsule (40 mg total) by mouth 2 (two) times daily for GERD 03/31/21     omeprazole (PRILOSEC) 40 MG capsule Take 1 capsule (40 mg total) by mouth 2 (two) times daily for GERD. 11/14/21     ondansetron (ZOFRAN) 4 MG tablet Take 1 tablet (4 mg total) by mouth every 8 (eight) hours as needed for nausea or vomiting. 01/31/22   Marybelle Killings, MD  predniSONE (STERAPRED UNI-PAK 21 TAB) 10 MG (21) TBPK tablet Use as directed 10/31/21    Evelina Dun A, FNP  propranolol (INDERAL) 10 MG tablet Take 10 mg by mouth 3 (three) times daily as needed. 12/12/18   [provider]  propranolol (INDERAL) 10 MG tablet Take 2 tablets (20 mg total) by mouth 2 (two) times daily as needed for tremors and anxiety 11/14/21     rOPINIRole (REQUIP) 2 MG tablet Take 1 tablet (2 mg total) by mouth 1-3 hours before bedtime. 01/10/21     rOPINIRole (REQUIP) 2 MG tablet Take 2 tablets (4 mg) by mouth daily. Take 1 to 3 hours before bedtime for RLS. 06/13/21     rOPINIRole (REQUIP) 2 MG tablet Take 2 tablets (4 mg total) by mouth1 to 3 hours before bedtime for RLS 08/31/21     rOPINIRole (REQUIP) 2 MG tablet Take 2 tablets (4 mg total) by mouth daily, 1-3 hours before bedtime for RLS 10/06/21     sertraline (ZOLOFT) 100 MG tablet Take 100 mg by mouth at bedtime. 12/10/18   [provider]  sulfamethoxazole-trimethoprim (BACTRIM DS) 800-160 MG tablet Take 1 tablet by mouth 2 (two) times daily. 07/26/21   [provider]  SYRINGE-NEEDLE, DISP, 3 ML (BD SAFETYGLIDE SYRINGE/NEEDLE) 25G X 1" 3 ML MISC Use the needle for intramuscular injection of B12 once a month. 08/22/21   Verlon Au, NP  tiZANidine (ZANAFLEX) 4 MG tablet Take 1 tablet (4 mg total) every 6 (six) hours as needed during daytime. Take 2 tablets (8 mg total) by mouth at bedtime for muscle spasms. 11/14/21     topiramate (TOPAMAX) 100 MG tablet Take 1 tablet (100 mg total) by mouth at bedtime. Patient not taking: Reported on 08/22/2021 08/16/20 10/11/21        Allergies    Penicillins and Penicillins    Review of Systems   Review of Systems  Physical Exam Updated Vital Signs BP (!) 142/82   Pulse 94   Temp 99.3 F (37.4 C) (Oral)   Resp (!) 28   Ht '5\' 4"'$  (1.626 m)   Wt 91.6 kg   LMP 03/06/2022   SpO2 98%   BMI 34.67 kg/m  Physical Exam Constitutional:      General: She is not in acute distress. HENT:     Head: Normocephalic and atraumatic.  Eyes:      Conjunctiva/sclera: Conjunctivae normal.     Pupils: Pupils are equal, round, and reactive to light.  Cardiovascular:     Rate and Rhythm: Normal rate and regular rhythm.     Pulses: Normal pulses.  Pulmonary:     Effort: Pulmonary effort is normal. No respiratory distress.  Breath sounds: Rhonchi present.     Comments: RR 30 Abdominal:     General: There is no distension.     Tenderness: There is no abdominal tenderness.  Skin:    General: Skin is warm and dry.  Neurological:     General: No focal deficit present.     Mental Status: She is alert. Mental status is at baseline.  Psychiatric:        Mood and Affect: Mood normal.        Behavior: Behavior normal.     ED Results / Procedures / Treatments   Labs (all labs ordered are listed, but only abnormal results are displayed) Labs Reviewed  BASIC METABOLIC PANEL - Abnormal; Notable for the following components:      Result Value   Sodium 132 (*)    Chloride 97 (*)    Glucose, Bld 124 (*)    All other components within normal limits  CBC - Abnormal; Notable for the following components:   WBC 23.5 (*)    All other components within normal limits  RESP PANEL BY RT-PCR (RSV, FLU A&B, COVID)  RVPGX2  PREGNANCY, URINE  TROPONIN I (HIGH SENSITIVITY)    EKG EKG Interpretation  Date/Time:  Tuesday April 11 2022 07:15:33 EST Ventricular Rate:  110 PR Interval:  135 QRS Duration: 83 QT Interval:  312 QTC Calculation: 422 R Axis:   90 Text Interpretation: Sinus tachycardia Confirmed by Octaviano Glow [id number]) on 04/11/2022 7:20:27 AM  Radiology DG Chest 2 View  Result Date: 04/11/2022 CLINICAL DATA:  47 year old female with history of shortness of breath. Anterior chest wall pain with inspiration since Friday. EXAM: CHEST - 2 VIEW COMPARISON:  Chest x-ray 11/06/2021. FINDINGS: Lung volumes are normal. Extensive bibasilar opacities are noted bilaterally, which may reflect areas of atelectasis and/or consolidation.  Trace bilateral pleural effusions. No pneumothorax. No definite suspicious appearing pulmonary nodules or masses. No evidence of pulmonary edema. Heart size is normal. Upper mediastinal contours are within normal limits. Orthopedic fixation hardware in the lower cervical spine incidentally noted. IMPRESSION: 1. Extensive bibasilar opacities which may reflect areas of atelectasis and/or consolidation. Trace bilateral pleural effusions are also noted. Electronically Signed   By: Vinnie Langton M.D.   On: 04/11/2022 07:19    Procedures Procedures    Medications Ordered in ED Medications  levofloxacin (LEVAQUIN) IVPB 750 mg (750 mg Intravenous New Bag/Given 04/11/22 0745)    ED Course/ Medical Decision Making/ A&P                             Medical Decision Making Amount and/or Complexity of Data Reviewed Labs: ordered. Radiology: ordered.  Risk Prescription drug management.   This patient presents to the ED with concern for shortness of breath, pleuritic chest pain. This involves an extensive number of treatment options, and is a complaint that carries with it a high risk of complications and morbidity.  The differential diagnosis includes pleuritis with pleural effusion versus pneumonia versus viral URI versus other  Co-morbidities that complicate the patient evaluation: Smoking history at higher risk of pulmonary complications  I ordered and personally interpreted labs.  The pertinent results include: White blood cell count 23,000.  COVID and flu are negative.  Troponin is unremarkable.  BMP largely unremarkable, very mild hyponatremia.  I ordered imaging studies including dg chest I independently visualized and interpreted imaging which showed bilateral consolidation with small effusion I agree with the radiologist  interpretation  The patient was maintained on a cardiac monitor.  I personally viewed and interpreted the cardiac monitored which showed an underlying rhythm of: Sinus  rhythm and sinus tachycardia  Per my interpretation the patient's ECG shows sinus tachycardia without acute ischemic findings  I ordered medication including IV Levaquin for suspected community pneumonia (given penicillin allergies)  I have reviewed the patients home medicines and have made adjustments as needed  Test Considered: Lower suspicion for acute PE in this clinical setting given the more likely cause of bronchopneumonia  Lower suspicion for pericarditis or cardiac complication.  Her symptoms seem to improve with lying flat, which did not be consistent with pericarditis, but can be consistent with pleurisy.  After the interventions noted above, I reevaluated the patient and found that they have: stayed the same   Dispostion:  After consideration of the diagnostic results and the patients response to treatment, I feel that the patent would benefit from close outpatient follow-up.  We did discuss the option of observation the hospital overnight given her tachypnea and her leukocytosis.  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  She lives with her husband who can help keep an eye on her, and they understand she needs to return to the hospital if her symptoms were to worsen, including her tachypnea, work of breathing, altered mental status, confusion, or any other emergency medical concerns.  I have a lower suspicion for sepsis at this time.  Lower suspicion for myocarditis or pericarditis.         Final Clinical Impression(s) / ED Diagnoses Final diagnoses:  Pneumonia of both lungs due to infectious organism, unspecified part of lung  Tachypnea    Rx / DC Orders ED Discharge Orders          Ordered    levofloxacin (LEVAQUIN) 750 MG tablet  Daily        04/11/22 0839              Wyvonnia Dusky, MD 04/11/22 [phone]

## 2022-04-11 NOTE — ED Notes (Signed)
Back from xray. Back to exam room from triage for EKG, ambulatory with slow steady gait.

## 2022-04-11 NOTE — ED Notes (Signed)
Up to br

## 2022-04-11 NOTE — ED Triage Notes (Signed)
Anterior chest wall pain with inspiration since last Friday.

## 2022-04-11 NOTE — ED Notes (Signed)
C/o pain with inspiration bilaterally. Pt alert, NAD, calm, interactive, resps shallow and mildly tachypneic, non-productive cough, LS clear and diminished bilaterally, smoker, does not use inhalers. H/o pleurisy. Presently dealing with dental infection, has taken clindamycin.

## 2022-04-13 ENCOUNTER — Ambulatory Visit: Payer: BC Managed Care – PPO | Admitting: Orthopaedic Surgery

## 2022-04-28 ENCOUNTER — Emergency Department (HOSPITAL_BASED_OUTPATIENT_CLINIC_OR_DEPARTMENT_OTHER): Payer: BC Managed Care – PPO | Admitting: Radiology

## 2022-04-28 ENCOUNTER — Other Ambulatory Visit: Payer: Self-pay

## 2022-04-28 ENCOUNTER — Inpatient Hospital Stay (HOSPITAL_BASED_OUTPATIENT_CLINIC_OR_DEPARTMENT_OTHER)
Admission: EM | Admit: 2022-04-28 | Discharge: 2022-04-30 | DRG: 315 | Disposition: A | Discharge status: Home / Self Care | Payer: BC Managed Care – PPO | Attending: Internal Medicine | Admitting: Internal Medicine

## 2022-04-28 ENCOUNTER — Encounter (HOSPITAL_BASED_OUTPATIENT_CLINIC_OR_DEPARTMENT_OTHER): Payer: Self-pay

## 2022-04-28 DIAGNOSIS — M797 Fibromyalgia: Secondary | ICD-10-CM | POA: Diagnosis present

## 2022-04-28 DIAGNOSIS — Z7952 Long term (current) use of systemic steroids: Secondary | ICD-10-CM | POA: Diagnosis not present

## 2022-04-28 DIAGNOSIS — E669 Obesity, unspecified: Secondary | ICD-10-CM | POA: Diagnosis present

## 2022-04-28 DIAGNOSIS — F419 Anxiety disorder, unspecified: Secondary | ICD-10-CM

## 2022-04-28 DIAGNOSIS — F32A Depression, unspecified: Secondary | ICD-10-CM | POA: Diagnosis not present

## 2022-04-28 DIAGNOSIS — Z1152 Encounter for screening for COVID-19: Secondary | ICD-10-CM

## 2022-04-28 DIAGNOSIS — F429 Obsessive-compulsive disorder, unspecified: Secondary | ICD-10-CM | POA: Diagnosis present

## 2022-04-28 DIAGNOSIS — I3139 Other pericardial effusion (noninflammatory): Secondary | ICD-10-CM | POA: Diagnosis not present

## 2022-04-28 DIAGNOSIS — D509 Iron deficiency anemia, unspecified: Secondary | ICD-10-CM | POA: Diagnosis present

## 2022-04-28 DIAGNOSIS — Z79899 Other long term (current) drug therapy: Secondary | ICD-10-CM | POA: Diagnosis not present

## 2022-04-28 DIAGNOSIS — F17210 Nicotine dependence, cigarettes, uncomplicated: Secondary | ICD-10-CM | POA: Diagnosis present

## 2022-04-28 DIAGNOSIS — Z880 Allergy status to penicillin: Secondary | ICD-10-CM | POA: Diagnosis not present

## 2022-04-28 DIAGNOSIS — F909 Attention-deficit hyperactivity disorder, unspecified type: Secondary | ICD-10-CM | POA: Diagnosis not present

## 2022-04-28 DIAGNOSIS — R0781 Pleurodynia: Secondary | ICD-10-CM | POA: Insufficient documentation

## 2022-04-28 DIAGNOSIS — Z6833 Body mass index (BMI) 33.0-33.9, adult: Secondary | ICD-10-CM

## 2022-04-28 DIAGNOSIS — Z9884 Bariatric surgery status: Secondary | ICD-10-CM | POA: Diagnosis not present

## 2022-04-28 DIAGNOSIS — K219 Gastro-esophageal reflux disease without esophagitis: Secondary | ICD-10-CM | POA: Diagnosis present

## 2022-04-28 DIAGNOSIS — E559 Vitamin D deficiency, unspecified: Secondary | ICD-10-CM | POA: Diagnosis present

## 2022-04-28 DIAGNOSIS — K838 Other specified diseases of biliary tract: Secondary | ICD-10-CM | POA: Diagnosis not present

## 2022-04-28 DIAGNOSIS — D72829 Elevated white blood cell count, unspecified: Secondary | ICD-10-CM | POA: Diagnosis not present

## 2022-04-28 DIAGNOSIS — Z8701 Personal history of pneumonia (recurrent): Secondary | ICD-10-CM

## 2022-04-28 DIAGNOSIS — K311 Adult hypertrophic pyloric stenosis: Secondary | ICD-10-CM | POA: Diagnosis not present

## 2022-04-28 DIAGNOSIS — R079 Chest pain, unspecified: Secondary | ICD-10-CM | POA: Diagnosis not present

## 2022-04-28 DIAGNOSIS — R0789 Other chest pain: Secondary | ICD-10-CM | POA: Diagnosis not present

## 2022-04-28 DIAGNOSIS — Z9049 Acquired absence of other specified parts of digestive tract: Secondary | ICD-10-CM

## 2022-04-28 LAB — CBC
HCT: 46.4 % — ABNORMAL HIGH (ref 36.0–46.0)
Hemoglobin: 15.6 g/dL — ABNORMAL HIGH (ref 12.0–15.0)
MCH: 29.9 pg (ref 26.0–34.0)
MCHC: 33.6 g/dL (ref 30.0–36.0)
MCV: 88.9 fL (ref 80.0–100.0)
Platelets: 514 10*3/uL — ABNORMAL HIGH (ref 150–400)
RBC: 5.22 MIL/uL — ABNORMAL HIGH (ref 3.87–5.11)
RDW: 13.7 % (ref 11.5–15.5)
WBC: 18.1 10*3/uL — ABNORMAL HIGH (ref 4.0–10.5)
nRBC: 0 % (ref 0.0–0.2)

## 2022-04-28 LAB — BASIC METABOLIC PANEL
Anion gap: 11 (ref 5–15)
BUN: 10 mg/dL (ref 6–20)
CO2: 24 mmol/L (ref 22–32)
Calcium: 10.7 mg/dL — ABNORMAL HIGH (ref 8.9–10.3)
Chloride: 102 mmol/L (ref 98–111)
Creatinine, Ser: 0.77 mg/dL (ref 0.44–1.00)
GFR, Estimated: >60 mL/min (ref >60)
Glucose, Bld: 92 mg/dL (ref 70–99)
Potassium: 4.1 mmol/L (ref 3.5–5.1)
Sodium: 137 mmol/L (ref 135–145)

## 2022-04-28 LAB — TROPONIN I (HIGH SENSITIVITY): Troponin I (High Sensitivity): 2 ng/L (ref <18)

## 2022-04-28 LAB — HCG, SERUM, QUALITATIVE: Preg, Serum: NEGATIVE

## 2022-04-28 MED ORDER — OXYCODONE-ACETAMINOPHEN 5-325 MG PO TABS
1.0000 | ORAL_TABLET | Freq: Once | ORAL | Status: AC
  Administered 2022-04-28: 1 via ORAL
  Filled 2022-04-28: qty 1

## 2022-04-28 NOTE — ED Provider Notes (Signed)
[facility] Provider Note   CSN: [id number] Arrival date & time: 04/28/22  2142     History {Add pertinent medical, surgical, social history, OB history to HPI:1} Chief Complaint  Patient presents with   Chest Pain    Leah Herrera is a 47 y.o. female.  HPI     This is a 47 year old female who presents with chest discomfort.  Patient was treated for pneumonia earlier this month with Levaquin.  She states that she had similar symptoms at that time.  Symptoms resolved and she no longer has cough, fever.  However, she states that her chest pain has increased again.  It is anterior nonradiating.  It is worse with deep breathing.  Denies ongoing fevers, shortness of breath, nausea, vomiting.  No history of blood clots, no recent travel, no lower extremity swelling or pain.  Home Medications Prior to Admission medications   Medication Sig Start Date End Date Taking? Authorizing Provider  albuterol (VENTOLIN HFA) 108 (90 Base) MCG/ACT inhaler Inhale 2 puffs into the lungs every 6 (six) hours as needed for wheezing or shortness of breath. 10/31/21   Sharion Balloon, FNP  ALPRAZolam Duanne Moron) 0.5 MG tablet Take 1 tablet (0.5 mg total) by mouth one time as needed for anxiety. 10/14/20     ALPRAZolam (XANAX) 0.5 MG tablet Take 1 tablet (0.5 mg total) by mouth daily as needed for anxiety 06/16/21     ALPRAZolam (XANAX) 0.5 MG tablet Take 1 tablet (0.5 mg total) by mouth one time as needed for anxiety 11/14/21     benzonatate (TESSALON PERLES) 100 MG capsule Take 1 capsule (100 mg total) by mouth 3 (three) times daily as needed. 10/31/21   Sharion Balloon, FNP  buPROPion (WELLBUTRIN SR) 150 MG 12 hr tablet Take 150 mg by mouth daily. 12/10/18   [provider]  buPROPion (WELLBUTRIN XL) 300 MG 24 hr tablet Take 1 tablet (300 mg total) by mouth in the morning. 08/31/21     cyanocobalamin (,VITAMIN B-12,) 1000 MCG/ML injection Inject 1 mL (1,000 mcg  total) into the muscle every 30 (thirty) days. 08/22/21   Verlon Au, NP  dextroamphetamine (DEXTROSTAT) 10 MG tablet Take 1 tablet (10 mg total) by mouth every morning. 11/14/21     ergocalciferol (VITAMIN D2) 1.25 MG (50000 UT) capsule Take 1 capsule (50,000 Units total) by mouth once a week. 05/05/21     ergocalciferol (VITAMIN D2) 1.25 MG (50000 UT) capsule Take 1 capsule (50,000 Units total) by mouth once a week. 08/31/21     Eszopiclone 3 MG TABS Take 3 mg by mouth at bedtime as needed. 12/12/18   [provider]  Eszopiclone 3 MG TABS Take 1 tablet (3 mg total) by mouth daily immediately before bedtime 06/23/21     Eszopiclone 3 MG TABS Take 1 tablet (3 mg total) by mouth daily immediately before bedtime 10/06/21     HYDROcodone-acetaminophen (NORCO/VICODIN) 5-325 MG tablet Take 1 tablet by mouth every 6 (six) hours as needed for moderate pain. 02/09/22   Marybelle Killings, MD  hydrOXYzine (ATARAX) 25 MG tablet Take 2 tablets (50 mg total) by mouth at bedtime for anxiety 10/06/21     hydrOXYzine (ATARAX/VISTARIL) 25 MG tablet Take 2 tablets (50 mg total) by mouth at bedtime. 05/11/20     lisdexamfetamine (VYVANSE) 60 MG capsule Take 1 capsule (60 mg total) by mouth daily in the afternoon. 11/14/21     omeprazole (PRILOSEC) 40  MG capsule Take 1 capsule (40 mg total) by mouth 2 (two) times daily for GERD 03/31/21     omeprazole (PRILOSEC) 40 MG capsule Take 1 capsule (40 mg total) by mouth 2 (two) times daily for GERD. 11/14/21     ondansetron (ZOFRAN) 4 MG tablet Take 1 tablet (4 mg total) by mouth every 8 (eight) hours as needed for nausea or vomiting. 01/31/22   Marybelle Killings, MD  predniSONE (STERAPRED UNI-PAK 21 TAB) 10 MG (21) TBPK tablet Use as directed 10/31/21   Evelina Dun A, FNP  propranolol (INDERAL) 10 MG tablet Take 10 mg by mouth 3 (three) times daily as needed. 12/12/18   [provider]  propranolol (INDERAL) 10 MG tablet Take 2 tablets (20 mg total) by mouth 2 (two) times  daily as needed for tremors and anxiety 11/14/21     rOPINIRole (REQUIP) 2 MG tablet Take 1 tablet (2 mg total) by mouth 1-3 hours before bedtime. 01/10/21     rOPINIRole (REQUIP) 2 MG tablet Take 2 tablets (4 mg) by mouth daily. Take 1 to 3 hours before bedtime for RLS. 06/13/21     rOPINIRole (REQUIP) 2 MG tablet Take 2 tablets (4 mg total) by mouth1 to 3 hours before bedtime for RLS 08/31/21     rOPINIRole (REQUIP) 2 MG tablet Take 2 tablets (4 mg total) by mouth daily, 1-3 hours before bedtime for RLS 10/06/21     sertraline (ZOLOFT) 100 MG tablet Take 100 mg by mouth at bedtime. 12/10/18   [provider]  sulfamethoxazole-trimethoprim (BACTRIM DS) 800-160 MG tablet Take 1 tablet by mouth 2 (two) times daily. 07/26/21   [provider]  SYRINGE-NEEDLE, DISP, 3 ML (BD SAFETYGLIDE SYRINGE/NEEDLE) 25G X 1" 3 ML MISC Use the needle for intramuscular injection of B12 once a month. 08/22/21   Verlon Au, NP  tiZANidine (ZANAFLEX) 4 MG tablet Take 1 tablet (4 mg total) every 6 (six) hours as needed during daytime. Take 2 tablets (8 mg total) by mouth at bedtime for muscle spasms. 11/14/21     topiramate (TOPAMAX) 100 MG tablet Take 1 tablet (100 mg total) by mouth at bedtime. Patient not taking: Reported on 08/22/2021 08/16/20 10/11/21        Allergies    Penicillins and Penicillins    Review of Systems   Review of Systems  Constitutional:  Negative for fever.  Respiratory:  Negative for shortness of breath.   Cardiovascular:  Positive for chest pain. Negative for leg swelling.  All other systems reviewed and are negative.   Physical Exam Updated Vital Signs BP (!) 136/95   Pulse 90   Temp 98.8 F (37.1 C)   Resp 20   Ht 1.626 m ('5\' 4"'$ )   Wt 88.9 kg   LMP 03/06/2022   SpO2 95%   BMI 33.64 kg/m  Physical Exam Vitals and nursing note reviewed.  Constitutional:      Appearance: She is well-developed.  HENT:     Head: Normocephalic and atraumatic.  Eyes:     Pupils:  Pupils are equal, round, and reactive to light.  Cardiovascular:     Rate and Rhythm: Normal rate and regular rhythm.     Heart sounds: Normal heart sounds.  Pulmonary:     Effort: Pulmonary effort is normal. No respiratory distress.     Breath sounds: Normal breath sounds. No wheezing.  Abdominal:     General: Bowel sounds are normal.     Palpations: Abdomen  is soft.  Musculoskeletal:     Cervical back: Neck supple.     Right lower leg: No tenderness. No edema.     Left lower leg: No tenderness. No edema.  Skin:    General: Skin is warm and dry.  Neurological:     General: No focal deficit present.     Mental Status: She is alert and oriented to person, place, and time.  Psychiatric:        Mood and Affect: Mood normal.     ED Results / Procedures / Treatments   Labs (all labs ordered are listed, but only abnormal results are displayed) Labs Reviewed  BASIC METABOLIC PANEL - Abnormal; Notable for the following components:      Result Value   Calcium 10.7 (*)    All other components within normal limits  CBC - Abnormal; Notable for the following components:   WBC 18.1 (*)    RBC 5.22 (*)    Hemoglobin 15.6 (*)    HCT 46.4 (*)    Platelets 514 (*)    All other components within normal limits  HCG, SERUM, QUALITATIVE  D-DIMER, QUANTITATIVE  TROPONIN I (HIGH SENSITIVITY)  TROPONIN I (HIGH SENSITIVITY)    EKG EKG Interpretation  Date/Time:  Friday April 28 2022 21:51:03 EST Ventricular Rate:  108 PR Interval:  126 QRS Duration: 76 QT Interval:  330 QTC Calculation: 442 R Axis:   66 Text Interpretation: Sinus tachycardia Nonspecific T wave abnormality Abnormal ECG When compared with ECG of 11-Apr-2022 07:15, PREVIOUS ECG IS PRESENT Confirmed by Thayer Jew [phone]) on 04/28/2022 11:29:01 PM  Radiology DG Chest 2 View  Result Date: 04/28/2022 CLINICAL DATA:  Chest pain EXAM: CHEST - 2 VIEW COMPARISON:  04/11/2022 FINDINGS: Bibasilar pulmonary infiltrates  previously noted have nearly resolved. No new focal pulmonary infiltrates are identified. No pneumothorax or pleural effusion. Cardiac size within normal limits. Pulmonary vascularity is normal. No acute bone abnormality. IMPRESSION: 1. Nearly resolved bibasilar pulmonary infiltrates. Electronically Signed   By: Fidela Salisbury M.D.   On: 04/28/2022 22:23    Procedures Procedures  {Document cardiac monitor, telemetry assessment procedure when appropriate:1}  Medications Ordered in ED Medications  oxyCODONE-acetaminophen (PERCOCET/ROXICET) 5-325 MG per tablet 1 tablet (has no administration in time range)    ED Course/ Medical Decision Making/ A&P   {   Click here for ABCD2, HEART and other calculatorsREFRESH Note before signing :1}                          Medical Decision Making Amount and/or Complexity of Data Reviewed Labs: ordered. Radiology: ordered.  Risk Prescription drug management.   ***  {Document critical care time when appropriate:1} {Document review of labs and clinical decision tools ie heart score, Chads2Vasc2 etc:1}  {Document your independent review of radiology images, and any outside records:1} {Document your discussion with family members, caretakers, and with consultants:1} {Document social determinants of health affecting pt's care:1} {Document your decision making why or why not admission, treatments were needed:1} Final Clinical Impression(s) / ED Diagnoses Final diagnoses:  None    Rx / DC Orders ED Discharge Orders     None

## 2022-04-28 NOTE — ED Triage Notes (Signed)
Patient here POV from Home.  Endorses CP that began 3 Days ago and has worsened since it began. Some Nausea. No Emesis. No fevers. No SOB.   Was seen earlier this Month and given Levaquin for PNA which caused symptoms to subside then.  NAD Noted during Triage. A&Ox4. GCS 15. Ambulatory.

## 2022-04-29 ENCOUNTER — Emergency Department (HOSPITAL_BASED_OUTPATIENT_CLINIC_OR_DEPARTMENT_OTHER): Payer: BC Managed Care – PPO

## 2022-04-29 ENCOUNTER — Inpatient Hospital Stay (HOSPITAL_COMMUNITY): Payer: BC Managed Care – PPO

## 2022-04-29 DIAGNOSIS — F419 Anxiety disorder, unspecified: Secondary | ICD-10-CM | POA: Diagnosis present

## 2022-04-29 DIAGNOSIS — F429 Obsessive-compulsive disorder, unspecified: Secondary | ICD-10-CM | POA: Diagnosis present

## 2022-04-29 DIAGNOSIS — D72829 Elevated white blood cell count, unspecified: Secondary | ICD-10-CM

## 2022-04-29 DIAGNOSIS — F32A Depression, unspecified: Secondary | ICD-10-CM

## 2022-04-29 DIAGNOSIS — R0781 Pleurodynia: Secondary | ICD-10-CM | POA: Diagnosis not present

## 2022-04-29 DIAGNOSIS — E669 Obesity, unspecified: Secondary | ICD-10-CM | POA: Insufficient documentation

## 2022-04-29 DIAGNOSIS — Z8701 Personal history of pneumonia (recurrent): Secondary | ICD-10-CM | POA: Diagnosis not present

## 2022-04-29 DIAGNOSIS — K311 Adult hypertrophic pyloric stenosis: Secondary | ICD-10-CM | POA: Diagnosis present

## 2022-04-29 DIAGNOSIS — Z9884 Bariatric surgery status: Secondary | ICD-10-CM | POA: Diagnosis not present

## 2022-04-29 DIAGNOSIS — Z7952 Long term (current) use of systemic steroids: Secondary | ICD-10-CM | POA: Diagnosis not present

## 2022-04-29 DIAGNOSIS — E559 Vitamin D deficiency, unspecified: Secondary | ICD-10-CM | POA: Diagnosis present

## 2022-04-29 DIAGNOSIS — I3139 Other pericardial effusion (noninflammatory): Principal | ICD-10-CM

## 2022-04-29 DIAGNOSIS — Z6833 Body mass index (BMI) 33.0-33.9, adult: Secondary | ICD-10-CM | POA: Diagnosis not present

## 2022-04-29 DIAGNOSIS — D509 Iron deficiency anemia, unspecified: Secondary | ICD-10-CM | POA: Diagnosis present

## 2022-04-29 DIAGNOSIS — F17210 Nicotine dependence, cigarettes, uncomplicated: Secondary | ICD-10-CM | POA: Diagnosis present

## 2022-04-29 DIAGNOSIS — F909 Attention-deficit hyperactivity disorder, unspecified type: Secondary | ICD-10-CM

## 2022-04-29 DIAGNOSIS — Z880 Allergy status to penicillin: Secondary | ICD-10-CM | POA: Diagnosis not present

## 2022-04-29 DIAGNOSIS — Z9049 Acquired absence of other specified parts of digestive tract: Secondary | ICD-10-CM | POA: Diagnosis not present

## 2022-04-29 DIAGNOSIS — Z79899 Other long term (current) drug therapy: Secondary | ICD-10-CM | POA: Diagnosis not present

## 2022-04-29 DIAGNOSIS — R079 Chest pain, unspecified: Secondary | ICD-10-CM | POA: Diagnosis not present

## 2022-04-29 DIAGNOSIS — M797 Fibromyalgia: Secondary | ICD-10-CM | POA: Diagnosis present

## 2022-04-29 DIAGNOSIS — K838 Other specified diseases of biliary tract: Secondary | ICD-10-CM | POA: Diagnosis present

## 2022-04-29 DIAGNOSIS — Z1152 Encounter for screening for COVID-19: Secondary | ICD-10-CM | POA: Diagnosis not present

## 2022-04-29 DIAGNOSIS — K219 Gastro-esophageal reflux disease without esophagitis: Secondary | ICD-10-CM | POA: Diagnosis present

## 2022-04-29 LAB — COMPREHENSIVE METABOLIC PANEL
ALT: 14 U/L (ref 0–44)
AST: 15 U/L (ref 15–41)
Albumin: 3.7 g/dL (ref 3.5–5.0)
Alkaline Phosphatase: 80 U/L (ref 38–126)
Anion gap: 11 (ref 5–15)
BUN: 7 mg/dL (ref 6–20)
CO2: 24 mmol/L (ref 22–32)
Calcium: 9.3 mg/dL (ref 8.9–10.3)
Chloride: 97 mmol/L — ABNORMAL LOW (ref 98–111)
Creatinine, Ser: 0.92 mg/dL (ref 0.44–1.00)
GFR, Estimated: >60 mL/min (ref >60)
Glucose, Bld: 86 mg/dL (ref 70–99)
Potassium: 3.9 mmol/L (ref 3.5–5.1)
Sodium: 132 mmol/L — ABNORMAL LOW (ref 135–145)
Total Bilirubin: 0.8 mg/dL (ref 0.3–1.2)
Total Protein: 7.2 g/dL (ref 6.5–8.1)

## 2022-04-29 LAB — HIV ANTIBODY (ROUTINE TESTING W REFLEX): HIV Screen 4th Generation wRfx: NONREACTIVE

## 2022-04-29 LAB — ECHOCARDIOGRAM COMPLETE
Area-P 1/2: 3.08 cm2
Height: 64 in
S' Lateral: 2.6 cm
Weight: 3164.04 oz

## 2022-04-29 LAB — CBC WITH DIFFERENTIAL/PLATELET
Abs Immature Granulocytes: 0.04 10*3/uL (ref 0.00–0.07)
Basophils Absolute: 0.1 10*3/uL (ref 0.0–0.1)
Basophils Relative: 1 %
Eosinophils Absolute: 0.5 10*3/uL (ref 0.0–0.5)
Eosinophils Relative: 4 %
HCT: 41.9 % (ref 36.0–46.0)
Hemoglobin: 13.8 g/dL (ref 12.0–15.0)
Immature Granulocytes: 0 %
Lymphocytes Relative: 19 %
Lymphs Abs: 2.2 10*3/uL (ref 0.7–4.0)
MCH: 30.1 pg (ref 26.0–34.0)
MCHC: 32.9 g/dL (ref 30.0–36.0)
MCV: 91.5 fL (ref 80.0–100.0)
Monocytes Absolute: 0.9 10*3/uL (ref 0.1–1.0)
Monocytes Relative: 8 %
Neutro Abs: 8.1 10*3/uL — ABNORMAL HIGH (ref 1.7–7.7)
Neutrophils Relative %: 68 %
Platelets: 436 10*3/uL — ABNORMAL HIGH (ref 150–400)
RBC: 4.58 MIL/uL (ref 3.87–5.11)
RDW: 13.8 % (ref 11.5–15.5)
WBC: 11.8 10*3/uL — ABNORMAL HIGH (ref 4.0–10.5)
nRBC: 0 % (ref 0.0–0.2)

## 2022-04-29 LAB — D-DIMER, QUANTITATIVE: D-Dimer, Quant: 1.39 ug/mL-FEU — ABNORMAL HIGH (ref 0.00–0.50)

## 2022-04-29 LAB — URINALYSIS, ROUTINE W REFLEX MICROSCOPIC
Bilirubin Urine: NEGATIVE
Glucose, UA: NEGATIVE mg/dL
Hgb urine dipstick: NEGATIVE
Ketones, ur: NEGATIVE mg/dL
Leukocytes,Ua: NEGATIVE
Nitrite: NEGATIVE
Protein, ur: NEGATIVE mg/dL
Specific Gravity, Urine: 1.005 (ref 1.005–1.030)
pH: 6 (ref 5.0–8.0)

## 2022-04-29 LAB — SEDIMENTATION RATE
Sed Rate: 21 mm/hr (ref 0–22)
Sed Rate: 25 mm/hr — ABNORMAL HIGH (ref 0–22)

## 2022-04-29 LAB — RESP PANEL BY RT-PCR (RSV, FLU A&B, COVID)  RVPGX2
Influenza A by PCR: NEGATIVE
Influenza B by PCR: NEGATIVE
Resp Syncytial Virus by PCR: NEGATIVE
SARS Coronavirus 2 by RT PCR: NEGATIVE

## 2022-04-29 LAB — C-REACTIVE PROTEIN
CRP: 2 mg/dL — ABNORMAL HIGH (ref <1.0)
CRP: 2.8 mg/dL — ABNORMAL HIGH (ref <1.0)

## 2022-04-29 LAB — TROPONIN I (HIGH SENSITIVITY)
Troponin I (High Sensitivity): 2 ng/L (ref <18)
Troponin I (High Sensitivity): 3 ng/L (ref <18)
Troponin I (High Sensitivity): 6 ng/L (ref <18)

## 2022-04-29 MED ORDER — BUPROPION HCL ER (SR) 150 MG PO TB12: 150.0000 mg | ORAL_TABLET | Freq: Every day | ORAL | Status: DC

## 2022-04-29 MED ORDER — ROPINIROLE HCL 0.5 MG PO TABS: 4.0000 mg | ORAL_TABLET | Freq: Every day | ORAL | Status: DC

## 2022-04-29 MED ORDER — HYDROCODONE-ACETAMINOPHEN 5-325 MG PO TABS
1.0000 | ORAL_TABLET | Freq: Four times a day (QID) | ORAL | Status: DC | PRN
  Administered 2022-04-29: 1 via ORAL
  Filled 2022-04-29: qty 1

## 2022-04-29 MED ORDER — OXYCODONE-ACETAMINOPHEN 5-325 MG PO TABS
1.0000 | ORAL_TABLET | ORAL | Status: AC | PRN
  Administered 2022-04-29 (×2): 1 via ORAL
  Filled 2022-04-29 (×2): qty 1

## 2022-04-29 MED ORDER — OXYCODONE-ACETAMINOPHEN 5-325 MG PO TABS
1.0000 | ORAL_TABLET | Freq: Once | ORAL | Status: AC
  Administered 2022-04-29: 1 via ORAL
  Filled 2022-04-29: qty 1

## 2022-04-29 MED ORDER — HYDROMORPHONE HCL 1 MG/ML IJ SOLN
0.5000 mg | INTRAMUSCULAR | Status: AC
  Administered 2022-04-29: 0.5 mg via INTRAVENOUS
  Filled 2022-04-29: qty 1

## 2022-04-29 MED ORDER — DEXTROAMPHETAMINE SULFATE 5 MG PO TABS: 10.0000 mg | ORAL_TABLET | Freq: Every morning | ORAL | Status: DC

## 2022-04-29 MED ORDER — ZOLPIDEM TARTRATE 5 MG PO TABS: 5.0000 mg | ORAL_TABLET | Freq: Every evening | ORAL | Status: DC | PRN

## 2022-04-29 MED ORDER — ACETAMINOPHEN 325 MG PO TABS: 650.0000 mg | ORAL_TABLET | Freq: Four times a day (QID) | ORAL | Status: DC | PRN

## 2022-04-29 MED ORDER — OXYCODONE-ACETAMINOPHEN 5-325 MG PO TABS
1.0000 | ORAL_TABLET | Freq: Four times a day (QID) | ORAL | Status: DC | PRN
  Administered 2022-04-29 – 2022-04-30 (×3): 1 via ORAL
  Filled 2022-04-29 (×3): qty 1

## 2022-04-29 MED ORDER — ACETAMINOPHEN 650 MG RE SUPP: 650.0000 mg | Freq: Four times a day (QID) | RECTAL | Status: DC | PRN

## 2022-04-29 MED ORDER — PROPRANOLOL HCL 10 MG PO TABS: 10.0000 mg | ORAL_TABLET | Freq: Three times a day (TID) | ORAL | Status: DC | PRN

## 2022-04-29 MED ORDER — BUPROPION HCL ER (XL) 150 MG PO TB24: 300.0000 mg | ORAL_TABLET | Freq: Every morning | ORAL | Status: DC

## 2022-04-29 MED ORDER — ONDANSETRON HCL 4 MG/2ML IJ SOLN: 4.0000 mg | Freq: Four times a day (QID) | INTRAMUSCULAR | Status: DC | PRN

## 2022-04-29 MED ORDER — IOHEXOL 350 MG/ML SOLN
100.0000 mL | Freq: Once | INTRAVENOUS | Status: AC | PRN
  Administered 2022-04-29: 100 mL via INTRAVENOUS

## 2022-04-29 MED ORDER — ALPRAZOLAM 0.5 MG PO TABS: 0.5000 mg | ORAL_TABLET | Freq: Every day | ORAL | Status: DC | PRN

## 2022-04-29 MED ORDER — BENZONATATE 100 MG PO CAPS: 100.0000 mg | ORAL_CAPSULE | Freq: Three times a day (TID) | ORAL | Status: DC | PRN

## 2022-04-29 MED ORDER — HYDROXYZINE HCL 25 MG PO TABS: 50.0000 mg | ORAL_TABLET | Freq: Every day | ORAL | Status: DC

## 2022-04-29 MED ORDER — ONDANSETRON HCL 4 MG PO TABS: 4.0000 mg | ORAL_TABLET | Freq: Four times a day (QID) | ORAL | Status: DC | PRN

## 2022-04-29 MED ORDER — KETOROLAC TROMETHAMINE 30 MG/ML IJ SOLN
30.0000 mg | Freq: Once | INTRAMUSCULAR | Status: AC
  Administered 2022-04-29: 30 mg via INTRAVENOUS
  Filled 2022-04-29: qty 1

## 2022-04-29 MED ORDER — POLYETHYLENE GLYCOL 3350 17 G PO PACK: 17.0000 g | PACK | Freq: Every day | ORAL | Status: DC | PRN

## 2022-04-29 MED ORDER — SERTRALINE HCL 100 MG PO TABS: 100.0000 mg | ORAL_TABLET | Freq: Every day | ORAL | Status: DC

## 2022-04-29 MED ORDER — PROPRANOLOL HCL 20 MG PO TABS: 20.0000 mg | ORAL_TABLET | Freq: Two times a day (BID) | ORAL | Status: DC | PRN

## 2022-04-29 MED ORDER — LISDEXAMFETAMINE DIMESYLATE 30 MG PO CAPS: 60.0000 mg | ORAL_CAPSULE | Freq: Every day | ORAL | Status: DC

## 2022-04-29 NOTE — Progress Notes (Signed)
  Echocardiogram 2D Echocardiogram has been performed.  Leah Herrera 04/29/2022, 1:24 PM

## 2022-04-29 NOTE — H&P (Addendum)
History and Physical    [name] [id number] DOB: 02/09/1976 DOA: 04/28/2022  PCP: Harlan Stains, MD Patient coming from: home  Chief Complaint: CP and SOB  HPI: Leah Herrera is a 47 y.o. female with medical history significant of chronic anemia, anxiety, depression, OCD and ADHD who was transferred from OSH ED for chest pain and shortness of breath.  Patient was recently evaluated at the ED on 26/6/24.  She was noted to have WBC 23.5 K and "Extensive bibasilar opacities" on CXR. Her influenza/COVID/RSV were negative.  She was offered admission but she preferred to receive treatment as outpatient.  She had completed a course of Levaquin and reports improvement of her symptoms.  Patient reports that she has had chest pain over the last 4 days.  Her chest pain was located to anterior chest area, constant pain, sharp pain, moderate pain, radiating to left shoulder.  Ambulation and any activities aggravate her chest pain.  Sitting up alleviated her chest pain.  She also reports shortness of breath when lying flat on the bed.  Denies fevers, chills, cough, wheezing, palpitations, nausea, vomiting, diarrhea, abdominal pain, dysuria, urinary frequency or urgency.  Patient went to OSH ED today.  She was hemodynamically stable.  The labs showed WBC 18.1, nonrevealing BMP, negative COVID/influenza/RSV, negative troponins.  She had elevated D-dimer and was noted to have pericardial effusion measuring up to 2 cm thickness on CTPA.  She was transferred to [facility] for further evaluation.   Review of Systems: As per HPI otherwise 10 point review of systems negative.  Review of Systems Otherwise negative except as per HPI, including: General: Denies fever, chills, night sweats or unintended weight loss. Resp: Denies cough, wheezing, shortness of breath. Cardiac: Denies palpitations, orthopnea, paroxysmal nocturnal dyspnea.Marland Kitchen  Positive for chest pain and shortness of breath. 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 ([place]) 03/31/2011   Anxiety    Arthritis    Cubital tunnel syndrome on right    Depression    OCD (obsessive compulsive disorder)    Vitamin D deficiency     Past Surgical History:  Procedure Laterality Date   BACK SURGERY     CHOLECYSTECTOMY     ESOPHAGOGASTRODUODENOSCOPY (EGD) WITH PROPOFOL N/A 11/27/2014   Procedure: ESOPHAGOGASTRODUODENOSCOPY (EGD) WITH PROPOFOL;  Surgeon: Hulen Luster, MD;  Location: [facility] ENDOSCOPY;  Service: Gastroenterology;  Laterality: N/A;   ESOPHAGOGASTRODUODENOSCOPY (EGD) WITH PROPOFOL N/A 09/12/2017   Procedure: ESOPHAGOGASTRODUODENOSCOPY (EGD) WITH PROPOFOL;  Surgeon: Toledo, Benay Pike, MD;  Location: [facility] ENDOSCOPY;  Service: Gastroenterology;  Laterality: N/A;   gastric bipass     GASTRIC BYPASS  2008   ORIF TIBIA & FIBULA FRACTURES     right leg repaired     transposition and decompression Right elbow   ULNAR NERVE TRANSPOSITION Right 08/28/2014   Procedure: RIGHT ULNAR NERVE DECOMPRESSION/ AND ANTERIOR TRANSPOSITION;  Surgeon: Roseanne Kaufman, MD;  Location: [place];  Service: Orthopedics;  Laterality: Right;    SOCIAL HISTORY:  reports that she has been smoking cigarettes. She has been smoking an average of .5 packs per day. She has never used smokeless tobacco. She reports that she does not drink alcohol and does not use  drugs.  Allergies  Allergen Reactions   Penicillins Hives and Rash   Penicillins Anaphylaxis    FAMILY HISTORY: Family History  Problem Relation Age of Onset   Lung cancer Maternal Aunt    Lung cancer Maternal Grandmother    Lung cancer Maternal Aunt      Prior to Admission medications   Medication Sig Start Date End Date Taking? Authorizing Provider  albuterol (VENTOLIN HFA) 108 (90 Base) MCG/ACT inhaler Inhale 2 puffs into the lungs every 6 (six) hours as needed for wheezing or shortness of breath. 10/31/21   Sharion Balloon, FNP  ALPRAZolam Duanne Moron) 0.5 MG tablet Take 1 tablet (0.5 mg total) by mouth one time as needed for anxiety. 10/14/20     ALPRAZolam (XANAX) 0.5 MG tablet Take 1 tablet (0.5 mg total) by mouth daily as needed for anxiety 06/16/21     ALPRAZolam (XANAX) 0.5 MG tablet Take 1 tablet (0.5 mg total) by mouth one time as needed for anxiety 11/14/21     benzonatate (TESSALON PERLES) 100 MG capsule Take 1 capsule (100 mg total) by mouth 3 (three) times daily as needed. 10/31/21   Sharion Balloon, FNP  buPROPion (WELLBUTRIN SR) 150 MG 12 hr tablet Take 150 mg by mouth daily. 12/10/18   [provider]  buPROPion (WELLBUTRIN XL) 300 MG 24 hr tablet Take 1 tablet (300 mg total) by mouth in the morning. 08/31/21     cyanocobalamin (,VITAMIN B-12,) 1000 MCG/ML injection Inject 1 mL (1,000 mcg total) into the muscle every 30 (thirty) days. 08/22/21   Verlon Au, NP  dextroamphetamine (DEXTROSTAT) 10 MG tablet Take 1 tablet (10 mg total) by mouth every morning. 11/14/21     ergocalciferol (VITAMIN D2) 1.25 MG (50000 UT) capsule Take 1 capsule (50,000 Units total) by mouth once a week. 05/05/21     ergocalciferol (VITAMIN D2) 1.25 MG (50000 UT) capsule Take 1 capsule (50,000 Units total) by mouth once a week. 08/31/21     Eszopiclone 3 MG TABS Take 3 mg by mouth at bedtime as needed. 12/12/18   [provider]  Eszopiclone 3 MG TABS Take 1 tablet (3 mg total) by  mouth daily immediately before bedtime 06/23/21     Eszopiclone 3 MG TABS Take 1 tablet (3 mg total) by mouth daily immediately before bedtime 10/06/21     HYDROcodone-acetaminophen (NORCO/VICODIN) 5-325 MG tablet Take 1 tablet by mouth every 6 (six) hours as needed for moderate pain. 02/09/22   Marybelle Killings, MD  hydrOXYzine (ATARAX) 25 MG tablet Take 2 tablets (50 mg total) by mouth at bedtime for anxiety 10/06/21     hydrOXYzine (ATARAX/VISTARIL) 25 MG tablet Take 2 tablets (50 mg total) by mouth at bedtime. 05/11/20     lisdexamfetamine (VYVANSE) 60 MG capsule Take 1 capsule (60 mg total) by mouth daily in the afternoon. 11/14/21     omeprazole (PRILOSEC) 40 MG capsule Take 1 capsule (40 mg total) by mouth 2 (two) times daily for GERD 03/31/21     omeprazole (PRILOSEC) 40 MG capsule Take 1 capsule (40 mg total) by mouth 2 (two) times daily for GERD. 11/14/21     ondansetron (ZOFRAN) 4 MG tablet Take 1 tablet (4 mg total) by mouth every 8 (eight) hours as  needed for nausea or vomiting. 01/31/22   Marybelle Killings, MD  predniSONE (STERAPRED UNI-PAK 21 TAB) 10 MG (21) TBPK tablet Use as directed 10/31/21   Evelina Dun A, FNP  propranolol (INDERAL) 10 MG tablet Take 10 mg by mouth 3 (three) times daily as needed. 12/12/18   [provider]  propranolol (INDERAL) 10 MG tablet Take 2 tablets (20 mg total) by mouth 2 (two) times daily as needed for tremors and anxiety 11/14/21     rOPINIRole (REQUIP) 2 MG tablet Take 1 tablet (2 mg total) by mouth 1-3 hours before bedtime. 01/10/21     rOPINIRole (REQUIP) 2 MG tablet Take 2 tablets (4 mg) by mouth daily. Take 1 to 3 hours before bedtime for RLS. 06/13/21     rOPINIRole (REQUIP) 2 MG tablet Take 2 tablets (4 mg total) by mouth1 to 3 hours before bedtime for RLS 08/31/21     rOPINIRole (REQUIP) 2 MG tablet Take 2 tablets (4 mg total) by mouth daily, 1-3 hours before bedtime for RLS 10/06/21     sertraline (ZOLOFT) 100 MG tablet Take 100 mg by mouth at bedtime.  12/10/18   [provider]  sulfamethoxazole-trimethoprim (BACTRIM DS) 800-160 MG tablet Take 1 tablet by mouth 2 (two) times daily. 07/26/21   [provider]  SYRINGE-NEEDLE, DISP, 3 ML (BD SAFETYGLIDE SYRINGE/NEEDLE) 25G X 1" 3 ML MISC Use the needle for intramuscular injection of B12 once a month. 08/22/21   Verlon Au, NP  tiZANidine (ZANAFLEX) 4 MG tablet Take 1 tablet (4 mg total) every 6 (six) hours as needed during daytime. Take 2 tablets (8 mg total) by mouth at bedtime for muscle spasms. 11/14/21     topiramate (TOPAMAX) 100 MG tablet Take 1 tablet (100 mg total) by mouth at bedtime. Patient not taking: Reported on 08/22/2021 08/16/20 10/11/21      Physical Exam: Vitals:   04/29/22 0835 04/29/22 0836 04/29/22 1016 04/29/22 1148  BP: 119/82 119/82  113/76  Pulse: 72 72  73  Resp: '20 20  18  '$ Temp:   97.8 F (36.6 C) 97.7 F (36.5 C)  TempSrc:   Oral Oral  SpO2: 97% 95%  96%  Weight:    89.7 kg  Height:    '5\' 4"'$  (1.626 m)      Constitutional: NAD, calm, comfortable Eyes: PERRL, lids and conjunctivae normal ENMT: Mucous membranes are moist. Posterior pharynx clear of any exudate or lesions.Normal dentition.  Neck: normal, supple, no masses, no thyromegaly Respiratory: clear to auscultation bilaterally, no wheezing, no crackles. Normal respiratory effort. No accessory muscle use.  Cardiovascular: Regular rate and rhythm, no murmurs / rubs / gallops. No extremity edema. 2+ pedal pulses. No carotid bruits.  Abdomen: no tenderness, no masses palpated. No hepatosplenomegaly. Bowel sounds positive.  Musculoskeletal: no clubbing / cyanosis. No joint deformity upper and lower extremities. Good ROM, no contractures. Normal muscle tone.  Skin: no rashes, lesions, ulcers. No induration Neurologic: CN 2-12 grossly intact. Sensation intact, DTR normal. Strength 5/5 in all 4.  Psychiatric: Normal judgment and insight. Alert and oriented x 3. Normal mood.     Labs on  Admission: I have personally reviewed following labs and imaging studies  CBC: Recent Labs  Lab 04/28/22 2211  WBC 18.1*  HGB 15.6*  HCT 46.4*  MCV 88.9  PLT 0000000*   Basic Metabolic Panel: Recent Labs  Lab 04/28/22 2211  Jakolby Sedivy 137  K 4.1  CL 102  CO2 24  GLUCOSE  92  BUN 10  CREATININE 0.77  CALCIUM 10.7*   GFR: Estimated Creatinine Clearance: 92.3 mL/min (by C-G formula based on SCr of 0.77 mg/dL). Liver Function Tests: No results for input(s): "AST", "ALT", "ALKPHOS", "BILITOT", "PROT", "ALBUMIN" in the last 168 hours. No results for input(s): "LIPASE", "AMYLASE" in the last 168 hours. No results for input(s): "AMMONIA" in the last 168 hours. Coagulation Profile: No results for input(s): "INR", "PROTIME" in the last 168 hours. Cardiac Enzymes: No results for input(s): "CKTOTAL", "CKMB", "CKMBINDEX", "TROPONINI" in the last 168 hours. BNP (last 3 results) No results for input(s): "PROBNP" in the last 8760 hours. HbA1C: No results for input(s): "HGBA1C" in the last 72 hours. CBG: No results for input(s): "GLUCAP" in the last 168 hours. Lipid Profile: No results for input(s): "CHOL", "HDL", "LDLCALC", "TRIG", "CHOLHDL", "LDLDIRECT" in the last 72 hours. Thyroid Function Tests: No results for input(s): "TSH", "T4TOTAL", "FREET4", "T3FREE", "THYROIDAB" in the last 72 hours. Anemia Panel: No results for input(s): "VITAMINB12", "FOLATE", "FERRITIN", "TIBC", "IRON", "RETICCTPCT" in the last 72 hours. Urine analysis:    Component Value Date/Time   COLORURINE YELLOW (A) 01/11/2019 1253   APPEARANCEUR HAZY (A) 01/11/2019 1253   APPEARANCEUR Clear 01/16/2013 0201   LABSPEC 1.009 01/11/2019 1253   LABSPEC 1.002 01/16/2013 0201   PHURINE 6.0 01/11/2019 1253   GLUCOSEU NEGATIVE 01/11/2019 1253   GLUCOSEU Negative 01/16/2013 0201   HGBUR NEGATIVE 01/11/2019 1253   BILIRUBINUR NEGATIVE 01/11/2019 1253   BILIRUBINUR Negative 01/16/2013 0201   KETONESUR NEGATIVE 01/11/2019 1253    PROTEINUR NEGATIVE 01/11/2019 1253   NITRITE NEGATIVE 01/11/2019 1253   LEUKOCYTESUR NEGATIVE 01/11/2019 1253   LEUKOCYTESUR Negative 01/16/2013 0201   Sepsis Labs: !!!!!!!!!!!!!!!!!!!!!!!!!!!!!!!!!!!!!!!!!!!! '@LABRCNTIP'$ (procalcitonin:4,lacticidven:4) ) Recent Results (from the past 240 hour(s))  Resp panel by RT-PCR (RSV, Flu A&B, Covid) Anterior Nasal Swab     Status: None   Collection Time: 04/29/22  2:12 AM   Specimen: Anterior Nasal Swab  Result Value Ref Range Status   SARS Coronavirus 2 by RT PCR NEGATIVE NEGATIVE Final    Comment: (NOTE) SARS-CoV-2 target nucleic acids are NOT DETECTED.  The SARS-CoV-2 RNA is generally detectable in upper respiratory specimens during the acute phase of infection. The lowest concentration of SARS-CoV-2 viral copies this assay can detect is 138 copies/mL. A negative result does not preclude SARS-Cov-2 infection and should not be used as the sole basis for treatment or other patient management decisions. A negative result may occur with  improper specimen collection/handling, submission of specimen other than nasopharyngeal swab, presence of viral mutation(s) within the areas targeted by this assay, and inadequate number of viral copies(<138 copies/mL). A negative result must be combined with clinical observations, patient history, and epidemiological information. The expected result is Negative.  Fact Sheet for Patients:  EntrepreneurPulse.com.au  Fact Sheet for Healthcare Providers:  IncredibleEmployment.be  This test is no t yet approved or cleared by the [place] FDA and  has been authorized for detection and/or diagnosis of SARS-CoV-2 by FDA under an Emergency Use Authorization (EUA). This EUA will remain  in effect (meaning this test can be used) for the duration of the COVID-19 declaration under Section 564(b)(1) of the Act, 21 U.S.C.section 360bbb-3(b)(1), unless the authorization is  terminated  or revoked sooner.       Influenza A by PCR NEGATIVE NEGATIVE Final   Influenza B by PCR NEGATIVE NEGATIVE Final    Comment: (NOTE) The Xpert Xpress SARS-CoV-2/FLU/RSV plus assay is intended as an aid in the  diagnosis of influenza from Nasopharyngeal swab specimens and should not be used as a sole basis for treatment. Nasal washings and aspirates are unacceptable for Xpert Xpress SARS-CoV-2/FLU/RSV testing.  Fact Sheet for Patients: EntrepreneurPulse.com.au  Fact Sheet for Healthcare Providers: IncredibleEmployment.be  This test is not yet approved or cleared by the [place] FDA and has been authorized for detection and/or diagnosis of SARS-CoV-2 by FDA under an Emergency Use Authorization (EUA). This EUA will remain in effect (meaning this test can be used) for the duration of the COVID-19 declaration under Section 564(b)(1) of the Act, 21 U.S.C. section 360bbb-3(b)(1), unless the authorization is terminated or revoked.     Resp Syncytial Virus by PCR NEGATIVE NEGATIVE Final    Comment: (NOTE) Fact Sheet for Patients: EntrepreneurPulse.com.au  Fact Sheet for Healthcare Providers: IncredibleEmployment.be  This test is not yet approved or cleared by the [place] FDA and has been authorized for detection and/or diagnosis of SARS-CoV-2 by FDA under an Emergency Use Authorization (EUA). This EUA will remain in effect (meaning this test can be used) for the duration of the COVID-19 declaration under Section 564(b)(1) of the Act, 21 U.S.C. section 360bbb-3(b)(1), unless the authorization is terminated or revoked.  Performed at [facility], [address] [place], [place] [address]      Radiological Exams on Admission: CT Angio Chest PE W and/or Wo Contrast  Result Date: 04/29/2022 CLINICAL DATA:  Worsening chest pain x3 days. EXAM: CT ANGIOGRAPHY CHEST WITH  CONTRAST TECHNIQUE: Multidetector CT imaging of the chest was performed using the standard protocol during bolus administration of intravenous contrast. Multiplanar CT image reconstructions and MIPs were obtained to evaluate the vascular anatomy. RADIATION DOSE REDUCTION: This exam was performed according to the departmental dose-optimization program which includes automated exposure control, adjustment of the mA and/or kV according to patient size and/or use of iterative reconstruction technique. CONTRAST:  115m OMNIPAQUE IOHEXOL 350 MG/ML SOLN COMPARISON:  PA Lat chest yesterday PA Lat chest 04/11/2022, portable chest 11/06/21. No prior CT. FINDINGS: Cardiovascular: Circumferential pericardial effusion, largest amount collecting underneath the heart and to the right and measuring up to 2 cm thickness. The fluid measures an intermediate density of 31 Hounsfield units suggesting proteinaceous content. Correlate clinically for pericarditis. The cardiac size is normal. No appreciable coronary calcification. Pulmonary arteries and veins are normal caliber and no arterial embolism is seen. The aorta and great vessels are normal. Mediastinum/Nodes: No enlarged intrathoracic or axillary lymph nodes or thyroid mass. Thoracic trachea and main bronchi are clear. The esophagus is fluid-filled and mildly patulous. There are postsurgical changes along the EG junction and proximal stomach stone and mild generalized esophageal thickening. Aspiration precautions are suggested. Lungs/Pleura: No pleural effusion or pneumothorax. Mild diffuse bronchial thickening noted without visible bronchial plugging. There is a low inspiration and mild elevation of the right diaphragm with scattered linear atelectasis in the lower lung fields. No pulmonary infiltrate or nodule is seen. Upper Abdomen: Status post cholecystectomy. Prominent common bile duct measuring 11 mm with mild intrahepatic biliary prominence, findings most probably due to the  prior cholecystectomy. Laboratory and clinical correlation suggested. A few calcified granulomas noted in the liver. Musculoskeletal: No chest wall abnormality. No acute or significant osseous findings. Review of the MIP images confirms the above findings. IMPRESSION: 1. No evidence of arterial dilatation or embolus. 2. Circumferential pericardial effusion, largest amount underneath the heart and to the right and measuring up to 2 cm thickness. The fluid measures an intermediate density suggesting proteinaceous content.  Correlate clinically for pericarditis. 3. Mild diffuse bronchial thickening without visible bronchial plugging. Query bronchitis or reactive airways disease. 4. Fluid-filled mildly patulous esophagus with mild generalized esophageal thickening. Aspiration precautions suggested. 5. Prominent common bile duct and intrahepatic biliary tree, most probably due to the prior cholecystectomy. Suggest laboratory and clinical correlation. Electronically Signed   By: Telford Nab M.D.   On: 04/29/2022 01:50   DG Chest 2 View  Result Date: 04/28/2022 CLINICAL DATA:  Chest pain EXAM: CHEST - 2 VIEW COMPARISON:  04/11/2022 FINDINGS: Bibasilar pulmonary infiltrates previously noted have nearly resolved. No new focal pulmonary infiltrates are identified. No pneumothorax or pleural effusion. Cardiac size within normal limits. Pulmonary vascularity is normal. No acute bone abnormality. IMPRESSION: 1. Nearly resolved bibasilar pulmonary infiltrates. Electronically Signed   By: Fidela Salisbury M.D.   On: 04/28/2022 22:23     All images have been reviewed by me personally.  EKG: Independently reviewed.   Assessment/Plan Principal Problem:   Pericardial effusion Active Problems:   Anemia, iron deficiency   Gastric outlet obstruction   Gastric bypass status for obesity   Pleuritic chest pain   Leukocytosis   Obesity (BMI 30-39.9)     Assessment and Plan:   Assessment Plan  # Pleuritic chest  pain # Large pericardial effusion on CTPA  Clinical manifestations consistent with pleuritic chest pain.   -She was admitted to progressive unit -Telemetry monitoring -Negative troponin -CRP and ESR -Stat echo pending -Cardiology consult- spoke to cardiology PA Rosaria Ferries for consult  - NPO for now pending Echo   # Leukocytosis # recent treated PNA  CTPA showed Mild diffuse bronchial thickening without visible bronchial plugging. Query bronchitis or reactive airways disease.   -Denies fevers, chills, cough, wheezing.or urinary s/s -Will repeat WBC, obtain blood cultures, esr - CXR- resolving previous PNA - UA pending- - negative COVID/Flu and RSV -  if all above negative, likely reactive  # Prominent common bile duct and intrahepatic biliary tree, most probably due to the prior cholecystectomy. Suggest laboratory and clinical correlation - denies abdomen pain, N/V/D - will obtain CMP   # anxiety, depression, OCD and ADHD   -Stable at baseline  # Obesity,  Body mass index is 33.94 kg/m. -Weight loss per PCP as outpatient  # status post gastric bypass 4 years ago, gastric outlet obstruction   CTPA showed 4. Fluid-filled mildly patulous esophagus with mild generalized esophageal thickening. Aspiration precautions suggested.  - aspiration precaution  # Chronic iron deficiency anemia  -Stable and at baseline            DVT prophylaxis: SCD Code Status: Full code Family Communication: Family at bedside Consults called: cardiology Admission status: inpt  Status is: Inpatient Remains inpatient appropriate because: requires two night stay   Time Spent: 65 minutes.  >50% of the time was devoted to discussing the patients care, assessment, plan and disposition with other care givers along with counseling the patient about the risks and benefits of treatment.    Charlann Lange MD [facility]  If 7PM-7AM, please contact night-coverage   04/29/2022, 1:22 PM

## 2022-04-29 NOTE — ED Notes (Signed)
Patient given ginger ale and second pillow per request.

## 2022-04-29 NOTE — ED Notes (Signed)
Pt sleeping. 

## 2022-04-29 NOTE — ED Notes (Signed)
Lauren with cl called for transport

## 2022-04-29 NOTE — Progress Notes (Signed)
Plan of Care Note for accepted transfer   Patient: Leah Herrera MRN: [id number]   [place]: 04/28/2022  Facility requesting transfer: [facility]. Requesting Provider: Dr. Dina Rich, [place] Reason for transfer: Pericardial effusion Facility course: The patient is a 47 year old female with past medical history significant for obesity, status post gastric bypass 4 years ago, gastric outlet obstruction, iron deficiency anemia, who presented to [facility] ED with complaints of chest pain, anterior, non-radiating, and pleuritic.  Worse with deep breath.  2 weeks ago was treated for pneumonia and completed course of antibiotics.  In the ED, high-sensitivity troponin negative.  She had a positive D-dimer which prompted CT angio chest.  CTA chest was negative for pulmonary embolism however showed pericardial effusion up to 2 cm.  Inflammatory markers ordered and are pending.  Requested that cardiology be contacted about new pericardial effusion.  The patient was admitted at [facility] progressive care unit as inpatient status.  Plan of care: The patient is accepted for admission to Progressive unit, at [facility].  Inpatient status.  Author: Kayleen Memos, DO 04/29/2022  Check [email or web address] for on-call coverage.  Nursing staff, Please call [facility] number on Amion as soon as patient's arrival, so appropriate admitting provider can evaluate the pt.

## 2022-04-30 DIAGNOSIS — I3139 Other pericardial effusion (noninflammatory): Secondary | ICD-10-CM | POA: Diagnosis not present

## 2022-04-30 LAB — CBC
HCT: 39.2 % (ref 36.0–46.0)
Hemoglobin: 12.9 g/dL (ref 12.0–15.0)
MCH: 30.3 pg (ref 26.0–34.0)
MCHC: 32.9 g/dL (ref 30.0–36.0)
MCV: 92 fL (ref 80.0–100.0)
Platelets: 364 10*3/uL (ref 150–400)
RBC: 4.26 MIL/uL (ref 3.87–5.11)
RDW: 13.7 % (ref 11.5–15.5)
WBC: 12.8 10*3/uL — ABNORMAL HIGH (ref 4.0–10.5)
nRBC: 0 % (ref 0.0–0.2)

## 2022-04-30 LAB — BASIC METABOLIC PANEL
Anion gap: 7 (ref 5–15)
BUN: 5 mg/dL — ABNORMAL LOW (ref 6–20)
CO2: 26 mmol/L (ref 22–32)
Calcium: 9 mg/dL (ref 8.9–10.3)
Chloride: 101 mmol/L (ref 98–111)
Creatinine, Ser: 0.82 mg/dL (ref 0.44–1.00)
GFR, Estimated: >60 mL/min (ref >60)
Glucose, Bld: 93 mg/dL (ref 70–99)
Potassium: 3.9 mmol/L (ref 3.5–5.1)
Sodium: 134 mmol/L — ABNORMAL LOW (ref 135–145)

## 2022-04-30 LAB — VITAMIN D 25 HYDROXY (VIT D DEFICIENCY, FRACTURES): Vit D, 25-Hydroxy: 74.2 ng/mL (ref 30–100)

## 2022-04-30 MED ORDER — COLCHICINE 0.6 MG PO TABS
0.6000 mg | ORAL_TABLET | Freq: Two times a day (BID) | ORAL | Status: DC
  Administered 2022-04-30: 0.6 mg via ORAL
  Filled 2022-04-30: qty 1

## 2022-04-30 MED ORDER — CYANOCOBALAMIN 1000 MCG/ML IJ SOLN
1000.0000 ug | Freq: Once | INTRAMUSCULAR | Status: AC
  Administered 2022-04-30: 1000 ug via INTRAMUSCULAR
  Filled 2022-04-30: qty 1

## 2022-04-30 MED ORDER — ALUM & MAG HYDROXIDE-SIMETH 200-200-20 MG/5ML PO SUSP
30.0000 mL | ORAL | Status: DC | PRN
  Administered 2022-04-30: 30 mL via ORAL
  Filled 2022-04-30: qty 30

## 2022-04-30 MED ORDER — PANTOPRAZOLE SODIUM 40 MG PO TBEC
40.0000 mg | DELAYED_RELEASE_TABLET | Freq: Two times a day (BID) | ORAL | Status: DC
  Administered 2022-04-30: 40 mg via ORAL
  Filled 2022-04-30: qty 1

## 2022-04-30 MED ORDER — IBUPROFEN 400 MG PO TABS: 400.0000 mg | ORAL_TABLET | Freq: Three times a day (TID) | ORAL | 0 refills | Status: AC

## 2022-04-30 MED ORDER — COLCHICINE 0.6 MG PO TABS: 0.6000 mg | ORAL_TABLET | Freq: Every day | ORAL | Status: DC

## 2022-04-30 MED ORDER — COLCHICINE 0.6 MG PO TABS: 0.6000 mg | ORAL_TABLET | Freq: Every day | ORAL | 0 refills | Status: AC

## 2022-04-30 MED ORDER — COLCHICINE 0.6 MG PO TABS: 0.6000 mg | ORAL_TABLET | Freq: Two times a day (BID) | ORAL | 0 refills | Status: AC

## 2022-04-30 MED ORDER — COLCHICINE 0.6 MG PO TABS: 0.6000 mg | ORAL_TABLET | Freq: Two times a day (BID) | ORAL | Status: DC

## 2022-04-30 MED ORDER — PANTOPRAZOLE SODIUM 40 MG PO TBEC: 40.0000 mg | DELAYED_RELEASE_TABLET | Freq: Two times a day (BID) | ORAL | 2 refills | Status: AC

## 2022-04-30 MED ORDER — PANTOPRAZOLE SODIUM 40 MG PO TBEC: 40.0000 mg | DELAYED_RELEASE_TABLET | Freq: Two times a day (BID) | ORAL | Status: DC

## 2022-04-30 MED ORDER — PROPRANOLOL HCL 10 MG PO TABS: 10.0000 mg | ORAL_TABLET | Freq: Three times a day (TID) | ORAL | 0 refills | Status: AC | PRN

## 2022-04-30 MED ORDER — KETOROLAC TROMETHAMINE 30 MG/ML IJ SOLN
30.0000 mg | Freq: Once | INTRAMUSCULAR | Status: AC
  Administered 2022-04-30: 30 mg via INTRAVENOUS
  Filled 2022-04-30: qty 1

## 2022-04-30 NOTE — Progress Notes (Incomplete)
PROGRESS NOTE    Leah Herrera  [id number] DOB: 03/04/1976 DOA: 04/28/2022 PCP: Harlan Stains, MD   Brief Narrative: 47 year old with past medical history significant for chronic anemia, anxiety, depression, OCD, ADHD present complaining of chest pain and shortness of breath. Recently evaluated in the ED 28/08/2022 for leukocytosis, extensive bibasilar opacity on chest x-ray.  Influenza COVID and RSV negative.  She was offered admission but prefer treatment as an outpatient.  She completed a course of Levaquin, and reported improvement of symptoms.  Present complaining of chest pain for the last 4 days.  She was noted to have elevation of D-dimer.  CT angio negative for PE.  Circumferential pericardial effusion, largest amount underneath the heart measuring up to 2 cm thickness.  Mild diffuse bronchial thickening without visible bronchial plugging.  Fluid-filled mildly patulous esophagus with mild generalized esophageal thickening.   Assessment & Plan:   Principal Problem:   Pericardial effusion Active Problems:   Anemia, iron deficiency   Gastric outlet obstruction   Gastric bypass status for obesity   Pleuritic chest pain   Leukocytosis   Obesity (BMI 30-39.9)   Anxiety and depression   Obsessive-compulsive disorder   Attention deficit hyperactivity disorder (ADHD)   1-Pleuritic chest pain:   Leukocytosis, recent pneumonia  Prominent common bile duct dilation and intrahepatic bile diarrhea 3:  Anxiety, depression, OCD, ADHD:  Obesity:  Status post gastric bypass:  Iron deficiency anemia:     Estimated body mass index is 33.94 kg/m as calculated from the following:   Height as of this encounter: '5\' 4"'$  (1.626 m).   Weight as of this encounter: 89.7 kg.   DVT prophylaxis:  Code Status:  Family Communication: Disposition Plan:  Status is: Inpatient {Inpatient:23812}    Consultants:  ***  Procedures:  ***  Antimicrobials:     Subjective: ***  Objective: Vitals:   04/29/22 1148 04/29/22 1717 04/29/22 2028 04/30/22 0517  BP: 113/76 117/82 137/83 119/85  Pulse: 73 73 91 89  Resp: '18 17 18 16  '$ Temp: 97.7 F (36.5 C) (!) 97.5 F (36.4 C) 97.6 F (36.4 C) 98 F (36.7 C)  TempSrc: Oral Oral Oral Axillary  SpO2: 96% 99% 99% 97%  Weight: 89.7 kg     Height: '5\' 4"'$  (1.626 m)       Intake/Output Summary (Last 24 hours) at 04/30/2022 0740 Last data filed at 04/29/2022 2100 Gross per 24 hour  Intake 120 ml  Output --  Net 120 ml   Filed Weights   04/28/22 2155 04/29/22 1148  Weight: 88.9 kg 89.7 kg    Examination:  General exam: Appears calm and comfortable  Respiratory system: Clear to auscultation. Respiratory effort normal. Cardiovascular system: S1 & S2 heard, RRR. No JVD, murmurs, rubs, gallops or clicks. No pedal edema. Gastrointestinal system: Abdomen is nondistended, soft and nontender. No organomegaly or masses felt. Normal bowel sounds heard. Central nervous system: Alert and oriented. No focal neurological deficits. Extremities: Symmetric 5 x 5 power. Skin: No rashes, lesions or ulcers Psychiatry: Judgement and insight appear normal. Mood & affect appropriate.     Data Reviewed: I have personally reviewed following labs and imaging studies  CBC: Recent Labs  Lab 04/28/22 2211 04/29/22 1240 04/30/22 0156  WBC 18.1* 11.8* 12.8*  NEUTROABS  --  8.1*  --   HGB 15.6* 13.8 12.9  HCT 46.4* 41.9 39.2  MCV 88.9 91.5 92.0  PLT 514* 436* [id number]   Basic Metabolic Panel: Recent Labs  Lab 04/28/22 2211  04/29/22 1240 04/30/22 0156  NA 137 132* 134*  K 4.1 3.9 3.9  CL 102 97* 101  CO2 '24 24 26  '$ GLUCOSE 92 86 93  BUN 10 7 5*  CREATININE 0.77 0.92 0.82  CALCIUM 10.7* 9.3 9.0   GFR: Estimated Creatinine Clearance: 90 mL/min (by C-G formula based on SCr of 0.82 mg/dL). Liver Function Tests: Recent Labs  Lab 04/29/22 1240  AST 15  ALT 14  ALKPHOS 80  BILITOT 0.8  PROT 7.2   ALBUMIN 3.7   No results for input(s): "LIPASE", "AMYLASE" in the last 168 hours. No results for input(s): "AMMONIA" in the last 168 hours. Coagulation Profile: No results for input(s): "INR", "PROTIME" in the last 168 hours. Cardiac Enzymes: No results for input(s): "CKTOTAL", "CKMB", "CKMBINDEX", "TROPONINI" in the last 168 hours. BNP (last 3 results) No results for input(s): "PROBNP" in the last 8760 hours. HbA1C: No results for input(s): "HGBA1C" in the last 72 hours. CBG: No results for input(s): "GLUCAP" in the last 168 hours. Lipid Profile: No results for input(s): "CHOL", "HDL", "LDLCALC", "TRIG", "CHOLHDL", "LDLDIRECT" in the last 72 hours. Thyroid Function Tests: No results for input(s): "TSH", "T4TOTAL", "FREET4", "T3FREE", "THYROIDAB" in the last 72 hours. Anemia Panel: No results for input(s): "VITAMINB12", "FOLATE", "FERRITIN", "TIBC", "IRON", "RETICCTPCT" in the last 72 hours. Sepsis Labs: No results for input(s): "PROCALCITON", "LATICACIDVEN" in the last 168 hours.  Recent Results (from the past 240 hour(s))  Resp panel by RT-PCR (RSV, Flu A&B, Covid) Anterior Nasal Swab     Status: None   Collection Time: 04/29/22  2:12 AM   Specimen: Anterior Nasal Swab  Result Value Ref Range Status   SARS Coronavirus 2 by RT PCR NEGATIVE NEGATIVE Final    Comment: (NOTE) SARS-CoV-2 target nucleic acids are NOT DETECTED.  The SARS-CoV-2 RNA is generally detectable in upper respiratory specimens during the acute phase of infection. The lowest concentration of SARS-CoV-2 viral copies this assay can detect is 138 copies/mL. A negative result does not preclude SARS-Cov-2 infection and should not be used as the sole basis for treatment or other patient management decisions. A negative result may occur with  improper specimen collection/handling, submission of specimen other than nasopharyngeal swab, presence of viral mutation(s) within the areas targeted by this assay, and  inadequate number of viral copies(<138 copies/mL). A negative result must be combined with clinical observations, patient history, and epidemiological information. The expected result is Negative.  Fact Sheet for Patients:  EntrepreneurPulse.com.au  Fact Sheet for Healthcare Providers:  IncredibleEmployment.be  This test is no t yet approved or cleared by the [place] FDA and  has been authorized for detection and/or diagnosis of SARS-CoV-2 by FDA under an Emergency Use Authorization (EUA). This EUA will remain  in effect (meaning this test can be used) for the duration of the COVID-19 declaration under Section 564(b)(1) of the Act, 21 U.S.C.section 360bbb-3(b)(1), unless the authorization is terminated  or revoked sooner.       Influenza A by PCR NEGATIVE NEGATIVE Final   Influenza B by PCR NEGATIVE NEGATIVE Final    Comment: (NOTE) The Xpert Xpress SARS-CoV-2/FLU/RSV plus assay is intended as an aid in the diagnosis of influenza from Nasopharyngeal swab specimens and should not be used as a sole basis for treatment. Nasal washings and aspirates are unacceptable for Xpert Xpress SARS-CoV-2/FLU/RSV testing.  Fact Sheet for Patients: EntrepreneurPulse.com.au  Fact Sheet for Healthcare Providers: IncredibleEmployment.be  This test is not yet approved or cleared by the [place]  States FDA and has been authorized for detection and/or diagnosis of SARS-CoV-2 by FDA under an Emergency Use Authorization (EUA). This EUA will remain in effect (meaning this test can be used) for the duration of the COVID-19 declaration under Section 564(b)(1) of the Act, 21 U.S.C. section 360bbb-3(b)(1), unless the authorization is terminated or revoked.     Resp Syncytial Virus by PCR NEGATIVE NEGATIVE Final    Comment: (NOTE) Fact Sheet for Patients: EntrepreneurPulse.com.au  Fact Sheet for Healthcare  Providers: IncredibleEmployment.be  This test is not yet approved or cleared by the [place] FDA and has been authorized for detection and/or diagnosis of SARS-CoV-2 by FDA under an Emergency Use Authorization (EUA). This EUA will remain in effect (meaning this test can be used) for the duration of the COVID-19 declaration under Section 564(b)(1) of the Act, 21 U.S.C. section 360bbb-3(b)(1), unless the authorization is terminated or revoked.  Performed at [facility], [address] [place], [place] [address]          Radiology Studies: ECHOCARDIOGRAM COMPLETE  Result Date: 04/29/2022    ECHOCARDIOGRAM REPORT   Patient Name:   Leah Herrera Date of Exam: 04/29/2022 Medical Rec #:  [id number]      Height:       64.0 in Accession #:    [id number]     Weight:       197.8 lb Date of Birth:  Jun 07, 1975      BSA:          1.947 m Patient Age:    27 years       BP:           113/76 mmHg Patient Gender: F              HR:           68 bpm. Exam Location:  Inpatient Procedure: 2D Echo STAT ECHO Indications:    pericardial effusion  History:        Patient has no prior history of Echocardiogram examinations.                 Risk Factors:Current Smoker.  Sonographer:    Johny Chess RDCS Referring Phys: [phone] NA LI IMPRESSIONS  1. There is no ventricular interdependence or tissue Doppler abnormalities seen with constrictive pericarditis. Left ventricular ejection fraction, by estimation, is 55 to 60%. The left ventricle has normal function. The left ventricle has no regional wall motion abnormalities. Left ventricular diastolic parameters were normal.  2. Pericardial effusion maximal diameter 0.22 cm, trivial seen in the apex and RV anterior. There is no evidence of cardiac tamponade.  3. The mitral valve was not well visualized. No evidence of mitral valve regurgitation. No evidence of mitral stenosis.  4. Right ventricular systolic function is normal. The  right ventricular size is normal. There is normal pulmonary artery systolic pressure.  5. The aortic valve is tricuspid. Aortic valve regurgitation is not visualized. No aortic stenosis is present.  6. The inferior vena cava is normal in size with greater than 50% respiratory variability, suggesting right atrial pressure of 3 mmHg. FINDINGS  Left Ventricle: There is no ventricular interdependence or tissue Doppler abnormalities seen with constrictive pericarditis. Left ventricular ejection fraction, by estimation, is 55 to 60%. The left ventricle has normal function. The left ventricle has no regional wall motion abnormalities. The left ventricular internal cavity size was normal in size. There is no left ventricular hypertrophy. Left ventricular diastolic parameters were normal. Right Ventricle:  The right ventricular size is normal. No increase in right ventricular wall thickness. Right ventricular systolic function is normal. There is normal pulmonary artery systolic pressure. The tricuspid regurgitant velocity is 2.30 m/s, and  with an assumed right atrial pressure of 3 mmHg, the estimated right ventricular systolic pressure is [id number] mmHg. Left Atrium: Left atrial size was normal in size. Right Atrium: Right atrial size was normal in size. Pericardium: Pericardial effusion maximal diameter 0.22 cm, trivial seen in the apex and RV anterior. Trivial pericardial effusion is present. The pericardial effusion is anterior to the right ventricle and surrounding the apex. There is no evidence of cardiac tamponade. Presence of epicardial fat layer. Mitral Valve: The mitral valve was not well visualized. No evidence of mitral valve regurgitation. No evidence of mitral valve stenosis. Tricuspid Valve: The tricuspid valve is normal in structure. Tricuspid valve regurgitation is mild . No evidence of tricuspid stenosis. Aortic Valve: The aortic valve is tricuspid. Aortic valve regurgitation is not visualized. No aortic  stenosis is present. Pulmonic Valve: The pulmonic valve was not well visualized. Pulmonic valve regurgitation is not visualized. No evidence of pulmonic stenosis. Aorta: The aortic root and ascending aorta are structurally normal, with no evidence of dilitation. Venous: The inferior vena cava is normal in size with greater than 50% respiratory variability, suggesting right atrial pressure of 3 mmHg. IAS/Shunts: No atrial level shunt detected by color flow Doppler.  LEFT VENTRICLE PLAX 2D LVIDd:         3.70 cm   Diastology LVIDs:         2.60 cm   LV e' medial:    9.03 cm/s LV PW:         1.00 cm   LV E/e' medial:  8.5 LV IVS:        1.10 cm   LV e' lateral:   12.40 cm/s LVOT diam:     1.80 cm   LV E/e' lateral: 6.2 LV SV:         63 LV SV Index:   32 LVOT Area:     2.54 cm  RIGHT VENTRICLE          IVC RV Basal diam:  2.40 cm  IVC diam: 1.70 cm TAPSE (M-mode): 2.1 cm LEFT ATRIUM             Index        RIGHT ATRIUM           Index LA diam:        3.50 cm 1.80 cm/m   RA Area:     14.20 cm LA Vol (A2C):   32.0 ml 16.44 ml/m  RA Volume:   35.20 ml  18.08 ml/m LA Vol (A4C):   35.3 ml 18.13 ml/m LA Biplane Vol: 33.9 ml 17.41 ml/m  AORTIC VALVE LVOT Vmax:   130.00 cm/s LVOT Vmean:  81.900 cm/s LVOT VTI:    0.247 m  AORTA Ao Root diam: 2.90 cm Ao Asc diam:  3.20 cm MITRAL VALVE               TRICUSPID VALVE MV Area (PHT): 3.08 cm    TR Peak grad:   21.2 mmHg MV Decel Time: 246 msec    TR Vmax:        230.00 cm/s MV E velocity: 76.50 cm/s MV A velocity: 51.90 cm/s  SHUNTS MV E/A ratio:  1.47        Systemic VTI:  0.25 m  Systemic Diam: 1.80 cm Rudean Haskell MD Electronically signed by Rudean Haskell MD Signature Date/Time: 04/29/2022/1:50:22 PM    Final    CT Angio Chest PE W and/or Wo Contrast  Result Date: 04/29/2022 CLINICAL DATA:  Worsening chest pain x3 days. EXAM: CT ANGIOGRAPHY CHEST WITH CONTRAST TECHNIQUE: Multidetector CT imaging of the chest was performed using  the standard protocol during bolus administration of intravenous contrast. Multiplanar CT image reconstructions and MIPs were obtained to evaluate the vascular anatomy. RADIATION DOSE REDUCTION: This exam was performed according to the departmental dose-optimization program which includes automated exposure control, adjustment of the mA and/or kV according to patient size and/or use of iterative reconstruction technique. CONTRAST:  173m OMNIPAQUE IOHEXOL 350 MG/ML SOLN COMPARISON:  PA Lat chest yesterday PA Lat chest 04/11/2022, portable chest 11/06/21. No prior CT. FINDINGS: Cardiovascular: Circumferential pericardial effusion, largest amount collecting underneath the heart and to the right and measuring up to 2 cm thickness. The fluid measures an intermediate density of 31 Hounsfield units suggesting proteinaceous content. Correlate clinically for pericarditis. The cardiac size is normal. No appreciable coronary calcification. Pulmonary arteries and veins are normal caliber and no arterial embolism is seen. The aorta and great vessels are normal. Mediastinum/Nodes: No enlarged intrathoracic or axillary lymph nodes or thyroid mass. Thoracic trachea and main bronchi are clear. The esophagus is fluid-filled and mildly patulous. There are postsurgical changes along the EG junction and proximal stomach stone and mild generalized esophageal thickening. Aspiration precautions are suggested. Lungs/Pleura: No pleural effusion or pneumothorax. Mild diffuse bronchial thickening noted without visible bronchial plugging. There is a low inspiration and mild elevation of the right diaphragm with scattered linear atelectasis in the lower lung fields. No pulmonary infiltrate or nodule is seen. Upper Abdomen: Status post cholecystectomy. Prominent common bile duct measuring 11 mm with mild intrahepatic biliary prominence, findings most probably due to the prior cholecystectomy. Laboratory and clinical correlation suggested. A few  calcified granulomas noted in the liver. Musculoskeletal: No chest wall abnormality. No acute or significant osseous findings. Review of the MIP images confirms the above findings. IMPRESSION: 1. No evidence of arterial dilatation or embolus. 2. Circumferential pericardial effusion, largest amount underneath the heart and to the right and measuring up to 2 cm thickness. The fluid measures an intermediate density suggesting proteinaceous content. Correlate clinically for pericarditis. 3. Mild diffuse bronchial thickening without visible bronchial plugging. Query bronchitis or reactive airways disease. 4. Fluid-filled mildly patulous esophagus with mild generalized esophageal thickening. Aspiration precautions suggested. 5. Prominent common bile duct and intrahepatic biliary tree, most probably due to the prior cholecystectomy. Suggest laboratory and clinical correlation. Electronically Signed   By: KTelford NabM.D.   On: 04/29/2022 01:50   DG Chest 2 View  Result Date: 04/28/2022 CLINICAL DATA:  Chest pain EXAM: CHEST - 2 VIEW COMPARISON:  04/11/2022 FINDINGS: Bibasilar pulmonary infiltrates previously noted have nearly resolved. No new focal pulmonary infiltrates are identified. No pneumothorax or pleural effusion. Cardiac size within normal limits. Pulmonary vascularity is normal. No acute bone abnormality. IMPRESSION: 1. Nearly resolved bibasilar pulmonary infiltrates. Electronically Signed   By: AFidela SalisburyM.D.   On: 04/28/2022 22:23        Scheduled Meds:  hydrOXYzine  50 mg Oral QHS   pantoprazole  40 mg Oral BID   sertraline  100 mg Oral QHS   Continuous Infusions:   LOS: 1 day    Time spent: 35 minutes    BElmarie Shiley MD [facility]   If  7PM-7AM, please contact night-coverage [email or web address]  04/30/2022, 7:40 AM

## 2022-04-30 NOTE — Discharge Summary (Signed)
Physician Discharge Summary   Patient: Leah Herrera MRN: [id number] DOB: 1975-09-03  Admit date:     04/28/2022  Discharge date: 04/30/22  Discharge Physician: Elmarie Shiley   PCP: Harlan Stains, MD   Recommendations at discharge:    Needs to follow up with GI for further evaluation of reflux and Patulous esophagus.  Needs referral to rheumatology Consider checking methylmalonic acid level For lower results of rheumatoid factor, ANA, and scleroderma diagnosis  profile  Discharge Diagnoses: Principal Problem:   Pericardial effusion Active Problems:   Anemia, iron deficiency   Gastric outlet obstruction   Gastric bypass status for obesity   Pleuritic chest pain   Leukocytosis   Obesity (BMI 30-39.9)   Anxiety and depression   Obsessive-compulsive disorder   Attention deficit hyperactivity disorder (ADHD)  Resolved Problems:   * No resolved hospital problems. *  Hospital Course: 47 year old with past medical history significant for chronic anemia, anxiety, depression, OCD, ADHD present complaining of chest pain and shortness of breath. Recently evaluated in the ED 28/08/2022 for leukocytosis, extensive bibasilar opacity on chest x-ray.  Influenza COVID and RSV negative.  She was offered admission but prefer treatment as an outpatient.  She completed a course of Levaquin, and reported improvement of symptoms.   Present complaining of chest pain for the last 4 days.  She was noted to have elevation of D-dimer.  CT angio negative for PE.  Circumferential pericardial effusion, largest amount underneath the heart measuring up to 2 cm thickness.  Mild diffuse bronchial thickening without visible bronchial plugging.  Fluid-filled mildly patulous esophagus with mild generalized esophageal thickening.  Assessment and Plan: 1-Pleuritic chest pain: Presents with pleuritic chest pain, CRP mildly elevated. EKG with subtle EKG changes.  CT showed with circumferential pericardial  effusion. ECHO mild effusion.  Evaluated by cardiology, who think mild pericarditis. Plan to discharge on Colchicine, and Ibuprofen. Would do short course of Ibuprofen due to prior ho gastric bypass and reflux.     Leukocytosis, recent pneumonia; denies worsening cough. No fever. CTA negative for PNA.    Prominent common bile duct dilation and intrahepatic bile probably related to prior cholecystectomy.  LFT normal.    Anxiety, depression, OCD, ADHD: Has not been taking Zoloft, or Xanax  Obesity: Lifestyle modification   Status post gastric bypass: She had reversal of gastric bypass surgery at [facility] 2019. Since she had surgery she has not had any more difficulty with swallowing CT chest show Patulous esophagus.  She will need follow-up with her gastroenterologist for repeat endoscopy  Iron deficiency anemia: Continue With ferrous sulfate   History of joint pain, weakness fatigue: Will check rheumatoid factor, ANA, scleroderma diagnosis panel.  She will need referral to rheumatology         Consultants: Cardiology  Procedures performed: ECHO Disposition: Home Diet recommendation:  Discharge Diet Orders (From admission, onward)     Start     Ordered   04/30/22 0000  Diet - low sodium heart healthy        04/30/22 1414           Cardiac diet DISCHARGE MEDICATION: Allergies as of 04/30/2022       Reactions   Penicillins Hives, Rash   Childhood reaction   Penicillins Anaphylaxis        Medication List     STOP taking these medications    omeprazole 40 MG capsule Commonly known as: PRILOSEC Replaced by: pantoprazole 40 MG tablet  TAKE these medications    albuterol 108 (90 Base) MCG/ACT inhaler Commonly known as: VENTOLIN HFA Inhale 2 puffs into the lungs every 6 (six) hours as needed for wheezing or shortness of breath.   colchicine 0.6 MG tablet Take 1 tablet (0.6 mg total) by mouth 2 (two) times daily for 3 days.   colchicine 0.6 MG  tablet Take 1 tablet (0.6 mg total) by mouth daily. Start taking on: May 03, 2022   GLUCOSAMINE CHONDROITIN JOINT PO Take 2 capsules by mouth daily.   ibuprofen 400 MG tablet Commonly known as: ADVIL Take 1 tablet (400 mg total) by mouth 3 (three) times daily for 5 days.   MAGNESIUM GLYCINATE PO Take 2 capsules by mouth daily.   pantoprazole 40 MG tablet Commonly known as: PROTONIX Take 1 tablet (40 mg total) by mouth 2 (two) times daily. Replaces: omeprazole 40 MG capsule   PROBIOTIC BLEND PO Take 1 Scoop by mouth daily.   propranolol 10 MG tablet Commonly known as: INDERAL Take 1 tablet (10 mg total) by mouth 3 (three) times daily as needed (agitation). What changed: reasons to take this   QC TUMERIC COMPLEX PO Take 3 capsules by mouth daily.   VITAMIN K2-VITAMIN D3 PO Take 1 mL by mouth daily.        Discharge Exam: Filed Weights   04/28/22 2155 04/29/22 1148  Weight: 88.9 kg 89.7 kg   General NAD  Condition at discharge: stable  The results of significant diagnostics from this hospitalization (including imaging, microbiology, ancillary and laboratory) are listed below for reference.   Imaging Studies: ECHOCARDIOGRAM COMPLETE  Result Date: 04/29/2022    ECHOCARDIOGRAM REPORT   Patient Name:   [name] Date of Exam: 04/29/2022 Medical Rec #:  [id number]      Height:       64.0 in Accession #:    [id number]     Weight:       197.8 lb Date of Birth:  1976-02-01      BSA:          1.947 m Patient Age:    47 years       BP:           113/76 mmHg Patient Gender: F              HR:           68 bpm. Exam Location:  Inpatient Procedure: 2D Echo STAT ECHO Indications:    pericardial effusion  History:        Patient has no prior history of Echocardiogram examinations.                 Risk Factors:Current Smoker.  Sonographer:    Johny Chess RDCS Referring Phys: [phone] NA LI IMPRESSIONS  1. There is no ventricular interdependence or tissue Doppler  abnormalities seen with constrictive pericarditis. Left ventricular ejection fraction, by estimation, is 55 to 60%. The left ventricle has normal function. The left ventricle has no regional wall motion abnormalities. Left ventricular diastolic parameters were normal.  2. Pericardial effusion maximal diameter 0.22 cm, trivial seen in the apex and RV anterior. There is no evidence of cardiac tamponade.  3. The mitral valve was not well visualized. No evidence of mitral valve regurgitation. No evidence of mitral stenosis.  4. Right ventricular systolic function is normal. The right ventricular size is normal. There is normal pulmonary artery systolic pressure.  5. The aortic valve is tricuspid. Aortic valve regurgitation  is not visualized. No aortic stenosis is present.  6. The inferior vena cava is normal in size with greater than 50% respiratory variability, suggesting right atrial pressure of 3 mmHg. FINDINGS  Left Ventricle: There is no ventricular interdependence or tissue Doppler abnormalities seen with constrictive pericarditis. Left ventricular ejection fraction, by estimation, is 55 to 60%. The left ventricle has normal function. The left ventricle has no regional wall motion abnormalities. The left ventricular internal cavity size was normal in size. There is no left ventricular hypertrophy. Left ventricular diastolic parameters were normal. Right Ventricle: The right ventricular size is normal. No increase in right ventricular wall thickness. Right ventricular systolic function is normal. There is normal pulmonary artery systolic pressure. The tricuspid regurgitant velocity is 2.30 m/s, and  with an assumed right atrial pressure of 3 mmHg, the estimated right ventricular systolic pressure is [id number] mmHg. Left Atrium: Left atrial size was normal in size. Right Atrium: Right atrial size was normal in size. Pericardium: Pericardial effusion maximal diameter 0.22 cm, trivial seen in the apex and RV anterior.  Trivial pericardial effusion is present. The pericardial effusion is anterior to the right ventricle and surrounding the apex. There is no evidence of cardiac tamponade. Presence of epicardial fat layer. Mitral Valve: The mitral valve was not well visualized. No evidence of mitral valve regurgitation. No evidence of mitral valve stenosis. Tricuspid Valve: The tricuspid valve is normal in structure. Tricuspid valve regurgitation is mild . No evidence of tricuspid stenosis. Aortic Valve: The aortic valve is tricuspid. Aortic valve regurgitation is not visualized. No aortic stenosis is present. Pulmonic Valve: The pulmonic valve was not well visualized. Pulmonic valve regurgitation is not visualized. No evidence of pulmonic stenosis. Aorta: The aortic root and ascending aorta are structurally normal, with no evidence of dilitation. Venous: The inferior vena cava is normal in size with greater than 50% respiratory variability, suggesting right atrial pressure of 3 mmHg. IAS/Shunts: No atrial level shunt detected by color flow Doppler.  LEFT VENTRICLE PLAX 2D LVIDd:         3.70 cm   Diastology LVIDs:         2.60 cm   LV e' medial:    9.03 cm/s LV PW:         1.00 cm   LV E/e' medial:  8.5 LV IVS:        1.10 cm   LV e' lateral:   12.40 cm/s LVOT diam:     1.80 cm   LV E/e' lateral: 6.2 LV SV:         63 LV SV Index:   32 LVOT Area:     2.54 cm  RIGHT VENTRICLE          IVC RV Basal diam:  2.40 cm  IVC diam: 1.70 cm TAPSE (M-mode): 2.1 cm LEFT ATRIUM             Index        RIGHT ATRIUM           Index LA diam:        3.50 cm 1.80 cm/m   RA Area:     14.20 cm LA Vol (A2C):   32.0 ml 16.44 ml/m  RA Volume:   35.20 ml  18.08 ml/m LA Vol (A4C):   35.3 ml 18.13 ml/m LA Biplane Vol: 33.9 ml 17.41 ml/m  AORTIC VALVE LVOT Vmax:   130.00 cm/s LVOT Vmean:  81.900 cm/s LVOT VTI:    0.247 m  AORTA Ao Root diam: 2.90 cm Ao Asc diam:  3.20 cm MITRAL VALVE               TRICUSPID VALVE MV Area (PHT): 3.08 cm    TR Peak grad:    21.2 mmHg MV Decel Time: 246 msec    TR Vmax:        230.00 cm/s MV E velocity: 76.50 cm/s MV A velocity: 51.90 cm/s  SHUNTS MV E/A ratio:  1.47        Systemic VTI:  0.25 m                            Systemic Diam: 1.80 cm Rudean Haskell MD Electronically signed by Rudean Haskell MD Signature Date/Time: 04/29/2022/1:50:22 PM    Final    CT Angio Chest PE W and/or Wo Contrast  Result Date: 04/29/2022 CLINICAL DATA:  Worsening chest pain x3 days. EXAM: CT ANGIOGRAPHY CHEST WITH CONTRAST TECHNIQUE: Multidetector CT imaging of the chest was performed using the standard protocol during bolus administration of intravenous contrast. Multiplanar CT image reconstructions and MIPs were obtained to evaluate the vascular anatomy. RADIATION DOSE REDUCTION: This exam was performed according to the departmental dose-optimization program which includes automated exposure control, adjustment of the mA and/or kV according to patient size and/or use of iterative reconstruction technique. CONTRAST:  171m OMNIPAQUE IOHEXOL 350 MG/ML SOLN COMPARISON:  PA Lat chest yesterday PA Lat chest 04/11/2022, portable chest 11/06/21. No prior CT. FINDINGS: Cardiovascular: Circumferential pericardial effusion, largest amount collecting underneath the heart and to the right and measuring up to 2 cm thickness. The fluid measures an intermediate density of 31 Hounsfield units suggesting proteinaceous content. Correlate clinically for pericarditis. The cardiac size is normal. No appreciable coronary calcification. Pulmonary arteries and veins are normal caliber and no arterial embolism is seen. The aorta and great vessels are normal. Mediastinum/Nodes: No enlarged intrathoracic or axillary lymph nodes or thyroid mass. Thoracic trachea and main bronchi are clear. The esophagus is fluid-filled and mildly patulous. There are postsurgical changes along the EG junction and proximal stomach stone and mild generalized esophageal thickening.  Aspiration precautions are suggested. Lungs/Pleura: No pleural effusion or pneumothorax. Mild diffuse bronchial thickening noted without visible bronchial plugging. There is a low inspiration and mild elevation of the right diaphragm with scattered linear atelectasis in the lower lung fields. No pulmonary infiltrate or nodule is seen. Upper Abdomen: Status post cholecystectomy. Prominent common bile duct measuring 11 mm with mild intrahepatic biliary prominence, findings most probably due to the prior cholecystectomy. Laboratory and clinical correlation suggested. A few calcified granulomas noted in the liver. Musculoskeletal: No chest wall abnormality. No acute or significant osseous findings. Review of the MIP images confirms the above findings. IMPRESSION: 1. No evidence of arterial dilatation or embolus. 2. Circumferential pericardial effusion, largest amount underneath the heart and to the right and measuring up to 2 cm thickness. The fluid measures an intermediate density suggesting proteinaceous content. Correlate clinically for pericarditis. 3. Mild diffuse bronchial thickening without visible bronchial plugging. Query bronchitis or reactive airways disease. 4. Fluid-filled mildly patulous esophagus with mild generalized esophageal thickening. Aspiration precautions suggested. 5. Prominent common bile duct and intrahepatic biliary tree, most probably due to the prior cholecystectomy. Suggest laboratory and clinical correlation. Electronically Signed   By: KTelford NabM.D.   On: 04/29/2022 01:50   DG Chest 2 View  Result Date: 04/28/2022 CLINICAL DATA:  Chest pain EXAM: CHEST -  2 VIEW COMPARISON:  04/11/2022 FINDINGS: Bibasilar pulmonary infiltrates previously noted have nearly resolved. No new focal pulmonary infiltrates are identified. No pneumothorax or pleural effusion. Cardiac size within normal limits. Pulmonary vascularity is normal. No acute bone abnormality. IMPRESSION: 1. Nearly resolved  bibasilar pulmonary infiltrates. Electronically Signed   By: Fidela Salisbury M.D.   On: 04/28/2022 22:23   DG Chest 2 View  Result Date: 04/11/2022 CLINICAL DATA:  47 year old female with history of shortness of breath. Anterior chest wall pain with inspiration since Friday. EXAM: CHEST - 2 VIEW COMPARISON:  Chest x-ray 11/06/2021. FINDINGS: Lung volumes are normal. Extensive bibasilar opacities are noted bilaterally, which may reflect areas of atelectasis and/or consolidation. Trace bilateral pleural effusions. No pneumothorax. No definite suspicious appearing pulmonary nodules or masses. No evidence of pulmonary edema. Heart size is normal. Upper mediastinal contours are within normal limits. Orthopedic fixation hardware in the lower cervical spine incidentally noted. IMPRESSION: 1. Extensive bibasilar opacities which may reflect areas of atelectasis and/or consolidation. Trace bilateral pleural effusions are also noted. Electronically Signed   By: Vinnie Langton M.D.   On: 04/11/2022 07:19    Microbiology: Results for orders placed or performed during the hospital encounter of 04/28/22  Resp panel by RT-PCR (RSV, Flu A&B, Covid) Anterior Nasal Swab     Status: None   Collection Time: 04/29/22  2:12 AM   Specimen: Anterior Nasal Swab  Result Value Ref Range Status   SARS Coronavirus 2 by RT PCR NEGATIVE NEGATIVE Final    Comment: (NOTE) SARS-CoV-2 target nucleic acids are NOT DETECTED.  The SARS-CoV-2 RNA is generally detectable in upper respiratory specimens during the acute phase of infection. The lowest concentration of SARS-CoV-2 viral copies this assay can detect is 138 copies/mL. A negative result does not preclude SARS-Cov-2 infection and should not be used as the sole basis for treatment or other patient management decisions. A negative result may occur with  improper specimen collection/handling, submission of specimen other than nasopharyngeal swab, presence of viral mutation(s)  within the areas targeted by this assay, and inadequate number of viral copies(<138 copies/mL). A negative result must be combined with clinical observations, patient history, and epidemiological information. The expected result is Negative.  Fact Sheet for Patients:  EntrepreneurPulse.com.au  Fact Sheet for Healthcare Providers:  IncredibleEmployment.be  This test is no t yet approved or cleared by the [place] FDA and  has been authorized for detection and/or diagnosis of SARS-CoV-2 by FDA under an Emergency Use Authorization (EUA). This EUA will remain  in effect (meaning this test can be used) for the duration of the COVID-19 declaration under Section 564(b)(1) of the Act, 21 U.S.C.section 360bbb-3(b)(1), unless the authorization is terminated  or revoked sooner.       Influenza A by PCR NEGATIVE NEGATIVE Final   Influenza B by PCR NEGATIVE NEGATIVE Final    Comment: (NOTE) The Xpert Xpress SARS-CoV-2/FLU/RSV plus assay is intended as an aid in the diagnosis of influenza from Nasopharyngeal swab specimens and should not be used as a sole basis for treatment. Nasal washings and aspirates are unacceptable for Xpert Xpress SARS-CoV-2/FLU/RSV testing.  Fact Sheet for Patients: EntrepreneurPulse.com.au  Fact Sheet for Healthcare Providers: IncredibleEmployment.be  This test is not yet approved or cleared by the [place] FDA and has been authorized for detection and/or diagnosis of SARS-CoV-2 by FDA under an Emergency Use Authorization (EUA). This EUA will remain in effect (meaning this test can be used) for the duration of the COVID-19 declaration  under Section 564(b)(1) of the Act, 21 U.S.C. section 360bbb-3(b)(1), unless the authorization is terminated or revoked.     Resp Syncytial Virus by PCR NEGATIVE NEGATIVE Final    Comment: (NOTE) Fact Sheet for  Patients: EntrepreneurPulse.com.au  Fact Sheet for Healthcare Providers: IncredibleEmployment.be  This test is not yet approved or cleared by the [place] FDA and has been authorized for detection and/or diagnosis of SARS-CoV-2 by FDA under an Emergency Use Authorization (EUA). This EUA will remain in effect (meaning this test can be used) for the duration of the COVID-19 declaration under Section 564(b)(1) of the Act, 21 U.S.C. section 360bbb-3(b)(1), unless the authorization is terminated or revoked.  Performed at [facility], [address] [place], [place] [address]     Labs: CBC: Recent Labs  Lab 04/28/22 2211 04/29/22 1240 04/30/22 0156  WBC 18.1* 11.8* 12.8*  NEUTROABS  --  8.1*  --   HGB 15.6* 13.8 12.9  HCT 46.4* 41.9 39.2  MCV 88.9 91.5 92.0  PLT 514* 436* [id number]   Basic Metabolic Panel: Recent Labs  Lab 04/28/22 2211 04/29/22 1240 04/30/22 0156  NA 137 132* 134*  K 4.1 3.9 3.9  CL 102 97* 101  CO2 '24 24 26  '$ GLUCOSE 92 86 93  BUN 10 7 5*  CREATININE 0.77 0.92 0.82  CALCIUM 10.7* 9.3 9.0   Liver Function Tests: Recent Labs  Lab 04/29/22 1240  AST 15  ALT 14  ALKPHOS 80  BILITOT 0.8  PROT 7.2  ALBUMIN 3.7   CBG: No results for input(s): "GLUCAP" in the last 168 hours.  Discharge time spent: greater than 30 minutes.  Signed: Elmarie Shiley, MD [facility] 04/30/2022

## 2022-04-30 NOTE — Consult Note (Addendum)
Cardiology Consultation   Patient ID: Leah Herrera MRN: [id number]; DOB: 26-Jul-1975  Admit date: 04/28/2022 Date of Consult: 04/30/2022  PCP:  Harlan Stains, MD   Time Providers Cardiologist:  None  new  Patient Profile:   Leah Herrera is a 47 y.o. female with a hx of ADHD, OCD, IDA, who is being seen 04/30/2022 for the evaluation of peric effusion at the request of Dr Tyrell Antonio.  History of Present Illness:   Ms. Alvino was seen in the ER on 02/06 with pleuritic chest pain. At that time, she was on clindamycin for a dental abscess. COVID/flu were negative. There was concern for pleurisy and PNA. Low suspicion for pericarditis since pain no worse w/ lying flat. Pt given Levaquin and d/c.  Pt admitted from [facility] 02/23 with similar sx. They had improved and then worsened. CT showed a large peric effusion >> tx to [facility]. Cards asked to see.   Ms.Fauth has a long history of chronic joint pain.  The pain is diffuse and in multiple joints.  She has been diagnosed with fibromyalgia, but takes no medications that help the pain and has never had any testing.  She was always told that it came from her depression.    She takes ibuprofen on a regular basis, and has some reflux because of that.  She takes Tylenol at times.  Neither medication helps her symptoms very much.  After she was diagnosed with pneumonia, she completed the antibiotics for that as well as for the dental abscess.  Her tooth does not bother her anymore, but is still needs to be pulled.  She says it is a dead tooth.  Her chest pain improved by the 13th.  She felt pretty good until a few days ago.  Symptoms started again this past Monday.  She has chest pain at a 3/10 at rest.  With deep breathing and moving around, it gets worse and can go up to a 7 or 8.  She has 2 areas of point tenderness, one is on the left side of her sternum at about the second rib.  The other 1 is near the lower rib edge, under her left  breast.  The pain has not changed any since she has been in the hospital.   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 ([place]) 03/31/2011   Anxiety    Arthritis    Cubital tunnel syndrome on right    Depression    OCD (obsessive compulsive disorder)    Vitamin D deficiency     Past Surgical History:  Procedure Laterality Date   BACK SURGERY     CHOLECYSTECTOMY     ESOPHAGOGASTRODUODENOSCOPY (EGD) WITH PROPOFOL N/A 11/27/2014   Procedure: ESOPHAGOGASTRODUODENOSCOPY (EGD) WITH PROPOFOL;  Surgeon: Hulen Luster, MD;  Location: [facility] ENDOSCOPY;  Service: Gastroenterology;  Laterality: N/A;   ESOPHAGOGASTRODUODENOSCOPY (EGD) WITH PROPOFOL N/A 09/12/2017   Procedure: ESOPHAGOGASTRODUODENOSCOPY (EGD) WITH PROPOFOL;  Surgeon: Toledo, Benay Pike, MD;  Location: [facility] ENDOSCOPY;  Service: Gastroenterology;  Laterality: N/A;   gastric bipass     GASTRIC BYPASS  2008   ORIF TIBIA & FIBULA FRACTURES     right leg repaired     transposition and decompression Right elbow   ULNAR NERVE TRANSPOSITION Right 08/28/2014   Procedure: RIGHT ULNAR NERVE  DECOMPRESSION/ AND ANTERIOR TRANSPOSITION;  Surgeon: Roseanne Kaufman, MD;  Location: [facility];  Service: Orthopedics;  Laterality: Right;     Home Medications:  Prior to Admission medications   Medication Sig Start Date End Date Taking? Authorizing Provider  albuterol (VENTOLIN HFA) 108 (90 Base) MCG/ACT inhaler Inhale 2 puffs into the lungs every 6 (six) hours as needed for wheezing or shortness of breath. 10/31/21  Yes Hawks, Christy A, FNP  Glucos-Chondroit-Hyaluron-MSM (GLUCOSAMINE CHONDROITIN JOINT PO) Take 2 capsules by mouth daily.   Yes [provider]  MAGNESIUM GLYCINATE PO Take 2 capsules by mouth daily.   Yes [provider]  Probiotic  Product (PROBIOTIC BLEND PO) Take 1 Scoop by mouth daily.   Yes [provider]  Turmeric (QC TUMERIC COMPLEX PO) Take 3 capsules by mouth daily.   Yes [provider]  Vitamin D-Vitamin K (VITAMIN K2-VITAMIN D3 PO) Take 1 mL by mouth daily.   Yes [provider]  omeprazole (PRILOSEC) 40 MG capsule Take 1 capsule (40 mg total) by mouth 2 (two) times daily for GERD. Patient taking differently: Take 80 mg by mouth daily. 11/14/21     topiramate (TOPAMAX) 100 MG tablet Take 1 tablet (100 mg total) by mouth at bedtime. Patient not taking: Reported on 08/22/2021 08/16/20 10/11/21      Inpatient Medications: Scheduled Meds:  hydrOXYzine  50 mg Oral QHS   pantoprazole  40 mg Oral BID   sertraline  100 mg Oral QHS   Continuous Infusions:  PRN Meds: acetaminophen **OR** acetaminophen, ALPRAZolam, alum & mag hydroxide-simeth, benzonatate, ondansetron **OR** ondansetron (ZOFRAN) IV, oxyCODONE-acetaminophen, polyethylene glycol, propranolol, propranolol, zolpidem  Allergies:    Allergies  Allergen Reactions   Penicillins Hives and Rash    Childhood reaction   Penicillins Anaphylaxis    Social History:   Social History   Socioeconomic History   Marital status: Married    Spouse name: Not on file   Number of children: Not on file   Years of education: Not on file   Highest education level: Not on file  Occupational History   Not on file  Tobacco Use   Smoking status: Every Day    Packs/day: 0.50    Types: Cigarettes   Smokeless tobacco: Never  Vaping Use   Vaping Use: Never used  Substance and Sexual Activity   Alcohol use: No    Comment: social   Drug use: No   Sexual activity: Yes  Other Topics Concern   Not on file  Social History Narrative   ** Merged History Encounter **       Social Determinants of Health   Financial Resource Strain: Not on file  Food Insecurity: No Food Insecurity (04/29/2022)   Hunger Vital Sign    Worried About Running  Out of Food in the Last Year: Never true    Ran Out of Food in the Last Year: Never true  Transportation Needs: No Transportation Needs (04/29/2022)   PRAPARE - Hydrologist (Medical): No    Lack of Transportation (Non-Medical): No  Physical Activity: Not on file  Stress: Not on file  Social Connections: Not on file  Intimate Partner Violence: Not At Risk (04/29/2022)   Humiliation, Afraid, Rape, and Kick questionnaire    Fear of Current or Ex-Partner: No    Emotionally Abused: No    Physically Abused: No    Sexually Abused: No    Family History:   Family History  Problem Relation Age of Onset   Lung cancer Maternal Aunt    Lung cancer Maternal Grandmother    Lung cancer Maternal Aunt      ROS:  Please see the history of present illness.  All other ROS reviewed and negative.     Physical Exam/Data:   Vitals:   04/29/22 1148 04/29/22 1717 04/29/22 2028 04/30/22 0517  BP: 113/76 117/82 137/83 119/85  Pulse: 73 73 91 89  Resp: '18 17 18 16  '$ Temp: 97.7 F (36.5 C) (!) 97.5 F (36.4 C) 97.6 F (36.4 C) 98 F (36.7 C)  TempSrc: Oral Oral Oral Axillary  SpO2: 96% 99% 99% 97%  Weight: 89.7 kg     Height: '5\' 4"'$  (1.626 m)       Intake/Output Summary (Last 24 hours) at 04/30/2022 0749 Last data filed at 04/29/2022 2100 Gross per 24 hour  Intake 120 ml  Output --  Net 120 ml      04/29/2022   11:48 AM 04/28/2022    9:55 PM 04/11/2022    6:54 AM  Last 3 Weights  Weight (lbs) 197 lb 12 oz 196 lb 202 lb  Weight (kg) 89.7 kg 88.905 kg 91.627 kg     Body mass index is 33.94 kg/m.  General:  Well nourished, well developed, in no acute distress HEENT: normal Neck: no JVD Vascular: No carotid bruits; Distal pulses 2+ bilaterally Cardiac:  normal S1, S2; RRR; no murmur, no rub heard Lungs:  clear to auscultation bilaterally, no wheezing, rhonchi or rales  Abd: soft, nontender, no hepatomegaly  Ext: no edema Musculoskeletal:  No deformities, BUE  and BLE strength normal and equal Skin: warm and dry, multiple tattoos, none recent Neuro:  CNs 2-12 intact, no focal abnormalities noted Psych:  Normal affect   EKG:  The EKG was personally reviewed and demonstrates: Sinus rhythm, heart rate 60, nonspecific ST and T wave changes, minimal J-point elevation in the septal and lateral leads Telemetry:  Telemetry was personally reviewed and demonstrates: Sinus rhythm  Relevant CV Studies:  ECHO: 04/29/2022  1. There is no ventricular interdependence or tissue Doppler  abnormalities seen with constrictive pericarditis. Left ventricular  ejection fraction, by estimation, is 55 to 60%. The left ventricle has normal function. The left ventricle has no regional  wall motion abnormalities. Left ventricular diastolic parameters were normal.   2. Pericardial effusion maximal diameter 0.22 cm, trivial seen in the apex and RV anterior. There is no evidence of cardiac tamponade.   3. The mitral valve was not well visualized. No evidence of mitral valve regurgitation. No evidence of mitral stenosis.   4. Right ventricular systolic function is normal. The right ventricular size is normal. There is normal pulmonary artery systolic pressure.   5. The aortic valve is tricuspid. Aortic valve regurgitation is not  visualized. No aortic stenosis is present.   6. The inferior vena cava is normal in size with greater than 50%  respiratory variability, suggesting right atrial pressure of 3 mmHg.   Laboratory Data:  High Sensitivity Troponin:   Recent Labs  Lab 04/11/22 0736 04/28/22 2211 04/28/22 2355 04/29/22 1240 04/29/22 1455  TROPONINIHS '5 2 2 3 6     '$ Chemistry Recent Labs  Lab 04/28/22 2211 04/29/22 1240 04/30/22 0156  NA 137 132* 134*  K 4.1 3.9 3.9  CL 102 97* 101  CO2 '24 24 26  '$ GLUCOSE 92 86 93  BUN 10 7 5*  CREATININE 0.77 0.92 0.82  CALCIUM 10.7*  9.3 9.0  GFRNONAA >60 >60 >60  ANIONGAP '11 11 7    '$ Recent Labs  Lab 04/29/22 1240   PROT 7.2  ALBUMIN 3.7  AST 15  ALT 14  ALKPHOS 80  BILITOT 0.8   Lipids No results for input(s): "CHOL", "TRIG", "HDL", "LABVLDL", "LDLCALC", "CHOLHDL" in the last 168 hours.  Hematology Recent Labs  Lab 04/28/22 2211 04/29/22 1240 04/30/22 0156  WBC 18.1* 11.8* 12.8*  RBC 5.22* 4.58 4.26  HGB 15.6* 13.8 12.9  HCT 46.4* 41.9 39.2  MCV 88.9 91.5 92.0  MCH 29.9 30.1 30.3  MCHC 33.6 32.9 32.9  RDW 13.7 13.8 13.7  PLT 514* 436* 364   Thyroid No results for input(s): "TSH", "FREET4" in the last 168 hours.  BNPNo results for input(s): "BNP", "PROBNP" in the last 168 hours.  DDimer  Recent Labs  Lab 04/28/22 2354  DDIMER 1.39*     Radiology/Studies:  ECHOCARDIOGRAM COMPLETE  Result Date: 04/29/2022    ECHOCARDIOGRAM REPORT   Patient Name:   JOLETH JULICH Date of Exam: 04/29/2022 Medical Rec #:  [id number]      Height:       64.0 in Accession #:    [id number]     Weight:       197.8 lb Date of Birth:  1975/08/02      BSA:          1.947 m Patient Age:    40 years       BP:           113/76 mmHg Patient Gender: F              HR:           68 bpm. Exam Location:  Inpatient Procedure: 2D Echo STAT ECHO Indications:    pericardial effusion  History:        Patient has no prior history of Echocardiogram examinations.                 Risk Factors:Current Smoker.  Sonographer:    Johny Chess RDCS Referring Phys: [phone] NA LI IMPRESSIONS  1. There is no ventricular interdependence or tissue Doppler abnormalities seen with constrictive pericarditis. Left ventricular ejection fraction, by estimation, is 55 to 60%. The left ventricle has normal function. The left ventricle has no regional wall motion abnormalities. Left ventricular diastolic parameters were normal.  2. Pericardial effusion maximal diameter 0.22 cm, trivial seen in the apex and RV anterior. There is no evidence of cardiac tamponade.  3. The mitral valve was not well visualized. No evidence of mitral valve regurgitation. No  evidence of mitral stenosis.  4. Right ventricular systolic function is normal. The right ventricular size is normal. There is normal pulmonary artery systolic pressure.  5. The aortic valve is tricuspid. Aortic valve regurgitation is not visualized. No aortic stenosis is present.  6. The inferior vena cava is normal in size with greater than 50% respiratory variability, suggesting right atrial pressure of 3 mmHg. FINDINGS  Left Ventricle: There is no ventricular interdependence or tissue Doppler abnormalities seen with constrictive pericarditis. Left ventricular ejection fraction, by estimation, is 55 to 60%. The left ventricle has normal function. The left ventricle has no regional wall motion abnormalities. The left ventricular internal cavity size was normal in size. There is no left ventricular hypertrophy. Left ventricular diastolic parameters were normal. Right Ventricle: The right ventricular size is normal. No increase in right ventricular wall thickness. Right ventricular systolic function is normal.  There is normal pulmonary artery systolic pressure. The tricuspid regurgitant velocity is 2.30 m/s, and  with an assumed right atrial pressure of 3 mmHg, the estimated right ventricular systolic pressure is [id number] mmHg. Left Atrium: Left atrial size was normal in size. Right Atrium: Right atrial size was normal in size. Pericardium: Pericardial effusion maximal diameter 0.22 cm, trivial seen in the apex and RV anterior. Trivial pericardial effusion is present. The pericardial effusion is anterior to the right ventricle and surrounding the apex. There is no evidence of cardiac tamponade. Presence of epicardial fat layer. Mitral Valve: The mitral valve was not well visualized. No evidence of mitral valve regurgitation. No evidence of mitral valve stenosis. Tricuspid Valve: The tricuspid valve is normal in structure. Tricuspid valve regurgitation is mild . No evidence of tricuspid stenosis. Aortic Valve: The aortic  valve is tricuspid. Aortic valve regurgitation is not visualized. No aortic stenosis is present. Pulmonic Valve: The pulmonic valve was not well visualized. Pulmonic valve regurgitation is not visualized. No evidence of pulmonic stenosis. Aorta: The aortic root and ascending aorta are structurally normal, with no evidence of dilitation. Venous: The inferior vena cava is normal in size with greater than 50% respiratory variability, suggesting right atrial pressure of 3 mmHg. IAS/Shunts: No atrial level shunt detected by color flow Doppler.  LEFT VENTRICLE PLAX 2D LVIDd:         3.70 cm   Diastology LVIDs:         2.60 cm   LV e' medial:    9.03 cm/s LV PW:         1.00 cm   LV E/e' medial:  8.5 LV IVS:        1.10 cm   LV e' lateral:   12.40 cm/s LVOT diam:     1.80 cm   LV E/e' lateral: 6.2 LV SV:         63 LV SV Index:   32 LVOT Area:     2.54 cm  RIGHT VENTRICLE          IVC RV Basal diam:  2.40 cm  IVC diam: 1.70 cm TAPSE (M-mode): 2.1 cm LEFT ATRIUM             Index        RIGHT ATRIUM           Index LA diam:        3.50 cm 1.80 cm/m   RA Area:     14.20 cm LA Vol (A2C):   32.0 ml 16.44 ml/m  RA Volume:   35.20 ml  18.08 ml/m LA Vol (A4C):   35.3 ml 18.13 ml/m LA Biplane Vol: 33.9 ml 17.41 ml/m  AORTIC VALVE LVOT Vmax:   130.00 cm/s LVOT Vmean:  81.900 cm/s LVOT VTI:    0.247 m  AORTA Ao Root diam: 2.90 cm Ao Asc diam:  3.20 cm MITRAL VALVE               TRICUSPID VALVE MV Area (PHT): 3.08 cm    TR Peak grad:   21.2 mmHg MV Decel Time: 246 msec    TR Vmax:        230.00 cm/s MV E velocity: 76.50 cm/s MV A velocity: 51.90 cm/s  SHUNTS MV E/A ratio:  1.47        Systemic VTI:  0.25 m  Systemic Diam: 1.80 cm Rudean Haskell MD Electronically signed by Rudean Haskell MD Signature Date/Time: 04/29/2022/1:50:22 PM    Final    CT Angio Chest PE W and/or Wo Contrast  Result Date: 04/29/2022 CLINICAL DATA:  Worsening chest pain x3 days. EXAM: CT ANGIOGRAPHY CHEST WITH  CONTRAST TECHNIQUE: Multidetector CT imaging of the chest was performed using the standard protocol during bolus administration of intravenous contrast. Multiplanar CT image reconstructions and MIPs were obtained to evaluate the vascular anatomy. RADIATION DOSE REDUCTION: This exam was performed according to the departmental dose-optimization program which includes automated exposure control, adjustment of the mA and/or kV according to patient size and/or use of iterative reconstruction technique. CONTRAST:  159m OMNIPAQUE IOHEXOL 350 MG/ML SOLN COMPARISON:  PA Lat chest yesterday PA Lat chest 04/11/2022, portable chest 11/06/21. No prior CT. FINDINGS: Cardiovascular: Circumferential pericardial effusion, largest amount collecting underneath the heart and to the right and measuring up to 2 cm thickness. The fluid measures an intermediate density of 31 Hounsfield units suggesting proteinaceous content. Correlate clinically for pericarditis. The cardiac size is normal. No appreciable coronary calcification. Pulmonary arteries and veins are normal caliber and no arterial embolism is seen. The aorta and great vessels are normal. Mediastinum/Nodes: No enlarged intrathoracic or axillary lymph nodes or thyroid mass. Thoracic trachea and main bronchi are clear. The esophagus is fluid-filled and mildly patulous. There are postsurgical changes along the EG junction and proximal stomach stone and mild generalized esophageal thickening. Aspiration precautions are suggested. Lungs/Pleura: No pleural effusion or pneumothorax. Mild diffuse bronchial thickening noted without visible bronchial plugging. There is a low inspiration and mild elevation of the right diaphragm with scattered linear atelectasis in the lower lung fields. No pulmonary infiltrate or nodule is seen. Upper Abdomen: Status post cholecystectomy. Prominent common bile duct measuring 11 mm with mild intrahepatic biliary prominence, findings most probably due to the  prior cholecystectomy. Laboratory and clinical correlation suggested. A few calcified granulomas noted in the liver. Musculoskeletal: No chest wall abnormality. No acute or significant osseous findings. Review of the MIP images confirms the above findings. IMPRESSION: 1. No evidence of arterial dilatation or embolus. 2. Circumferential pericardial effusion, largest amount underneath the heart and to the right and measuring up to 2 cm thickness. The fluid measures an intermediate density suggesting proteinaceous content. Correlate clinically for pericarditis. 3. Mild diffuse bronchial thickening without visible bronchial plugging. Query bronchitis or reactive airways disease. 4. Fluid-filled mildly patulous esophagus with mild generalized esophageal thickening. Aspiration precautions suggested. 5. Prominent common bile duct and intrahepatic biliary tree, most probably due to the prior cholecystectomy. Suggest laboratory and clinical correlation. Electronically Signed   By: KTelford NabM.D.   On: 04/29/2022 01:50   DG Chest 2 View  Result Date: 04/28/2022 CLINICAL DATA:  Chest pain EXAM: CHEST - 2 VIEW COMPARISON:  04/11/2022 FINDINGS: Bibasilar pulmonary infiltrates previously noted have nearly resolved. No new focal pulmonary infiltrates are identified. No pneumothorax or pleural effusion. Cardiac size within normal limits. Pulmonary vascularity is normal. No acute bone abnormality. IMPRESSION: 1. Nearly resolved bibasilar pulmonary infiltrates. Electronically Signed   By: AFidela SalisburyM.D.   On: 04/28/2022 22:23     Assessment and Plan:   Chest pain: -Pain is typical for pericardial pain, enzymes are negative and ECG not acute so not myocarditis. -She chronically takes ibuprofen and Tylenol for her multiple diffuse joint pain which is ineffective -She has gotten short-lived relief from Dilaudid and more relief from oxycodone, but that does not treat the  source -Will try IV Toradol and  colchicine -No further ischemic evaluation is indicated -The pericardial effusion that appeared large on CT is actually trivial, so no further evaluation on that - if pt gets relief from treatment, possible d/c this pm   Risk Assessment/Risk Scores:      For questions or updates, please contact [place] Please consult [email or web address] for contact info under    Signed, Rosaria Ferries, PA-C  04/30/2022 7:49 AM  Cardiology Attending  Patient seen and examined. Agree with above. The patient presents with pleuritic chest pain and a small pericardial effusion by echo. She has a RRR with no friction rub, and clear lungs. No edema. Neuro is non-focal. ECG show NSR with subtle STT changes. Labs are pending A/P Likely mild pericardiitis - I would recommend IV toradol and colchicine as an outpatient. Avoid narcotics. Use either motrin or indomethacin for 2 weeks or until symptoms are improved. She can go home later today.   Carleene Overlie Kela Baccari,MD

## 2022-05-01 LAB — ANA W/REFLEX IF POSITIVE: Anti Nuclear Antibody (ANA): NEGATIVE

## 2022-05-01 LAB — SCLERODERMA DIAGNOSTIC PROFILE
Anti Nuclear Antibody (ANA): NEGATIVE
Scleroderma (Scl-70) (ENA) Antibody, IgG: <0.2 AI (ref 0.0–0.9)

## 2022-05-01 LAB — RHEUMATOID FACTOR: Rheumatoid fact SerPl-aCnc: <10 IU/mL (ref <14.0)

## 2022-05-05 ENCOUNTER — Other Ambulatory Visit (HOSPITAL_COMMUNITY): Payer: Self-pay

## 2022-05-09 DIAGNOSIS — K219 Gastro-esophageal reflux disease without esophagitis: Secondary | ICD-10-CM | POA: Diagnosis not present

## 2022-05-09 DIAGNOSIS — I309 Acute pericarditis, unspecified: Secondary | ICD-10-CM | POA: Diagnosis not present

## 2022-05-09 DIAGNOSIS — M654 Radial styloid tenosynovitis [de Quervain]: Secondary | ICD-10-CM | POA: Diagnosis not present

## 2022-05-09 DIAGNOSIS — M2550 Pain in unspecified joint: Secondary | ICD-10-CM | POA: Diagnosis not present

## 2022-05-09 DIAGNOSIS — F17200 Nicotine dependence, unspecified, uncomplicated: Secondary | ICD-10-CM | POA: Diagnosis not present

## 2022-05-10 ENCOUNTER — Encounter (HOSPITAL_COMMUNITY): Payer: Self-pay | Admitting: Family Medicine

## 2022-05-10 DIAGNOSIS — I309 Acute pericarditis, unspecified: Secondary | ICD-10-CM

## 2022-05-16 ENCOUNTER — Other Ambulatory Visit (HOSPITAL_COMMUNITY): Payer: Self-pay | Admitting: Family Medicine

## 2022-05-16 DIAGNOSIS — I309 Acute pericarditis, unspecified: Secondary | ICD-10-CM

## 2022-05-29 DIAGNOSIS — M25532 Pain in left wrist: Secondary | ICD-10-CM | POA: Diagnosis not present

## 2022-05-29 DIAGNOSIS — M654 Radial styloid tenosynovitis [de Quervain]: Secondary | ICD-10-CM | POA: Diagnosis not present

## 2022-06-30 ENCOUNTER — Ambulatory Visit (HOSPITAL_COMMUNITY): Payer: BC Managed Care – PPO

## 2022-06-30 ENCOUNTER — Encounter (HOSPITAL_COMMUNITY): Payer: Self-pay

## 2022-07-27 ENCOUNTER — Telehealth: Payer: Self-pay | Admitting: Radiology

## 2022-07-27 NOTE — Telephone Encounter (Signed)
Patient called [place] office stating she has [facility] policy and requests records and itemized bills from her visits with Dr. Ophelia Charter. Last seen in January.  Can you tell me where I need to request itemized bills?  CB for patient [phone]

## 2022-07-27 NOTE — Telephone Encounter (Signed)
IC patient,lmvm advised to contact [facility] billing department for billing and I left phone numbers for the billing dept. Also when calling the main number, there is a prompt to select for billing.

## 2023-07-13 DIAGNOSIS — E538 Deficiency of other specified B group vitamins: Secondary | ICD-10-CM | POA: Diagnosis not present

## 2023-07-13 DIAGNOSIS — R4586 Emotional lability: Secondary | ICD-10-CM | POA: Diagnosis not present

## 2023-07-13 DIAGNOSIS — K219 Gastro-esophageal reflux disease without esophagitis: Secondary | ICD-10-CM | POA: Diagnosis not present

## 2023-07-13 DIAGNOSIS — D509 Iron deficiency anemia, unspecified: Secondary | ICD-10-CM | POA: Diagnosis not present

## 2023-07-13 DIAGNOSIS — Z8711 Personal history of peptic ulcer disease: Secondary | ICD-10-CM | POA: Diagnosis not present

## 2023-07-13 DIAGNOSIS — E559 Vitamin D deficiency, unspecified: Secondary | ICD-10-CM | POA: Diagnosis not present

## 2023-07-13 DIAGNOSIS — Z299 Encounter for prophylactic measures, unspecified: Secondary | ICD-10-CM | POA: Diagnosis not present

## 2024-02-11 DIAGNOSIS — J069 Acute upper respiratory infection, unspecified: Secondary | ICD-10-CM | POA: Diagnosis not present

## 2024-02-11 DIAGNOSIS — R4586 Emotional lability: Secondary | ICD-10-CM | POA: Diagnosis not present
